# Patient Record
Sex: Female | Born: 1955
Health system: Southern US, Community
[De-identification: ages and names within clinical notes are randomized; demographics above are authoritative.]

## PROBLEM LIST (undated history)

## (undated) DIAGNOSIS — I10 Essential (primary) hypertension: Secondary | ICD-10-CM

## (undated) HISTORY — PX: NO PAST SURGERIES: SHX2092

## (undated) HISTORY — DX: Essential (primary) hypertension: I10

---

## 2003-07-31 ENCOUNTER — Other Ambulatory Visit: Admission: RE | Admit: 2003-07-31 | Discharge: 2003-07-31 | Payer: Self-pay | Admitting: Obstetrics & Gynecology

## 2003-07-31 ENCOUNTER — Encounter: Admission: RE | Admit: 2003-07-31 | Discharge: 2003-07-31 | Payer: Self-pay | Admitting: Obstetrics and Gynecology

## 2003-07-31 ENCOUNTER — Encounter (INDEPENDENT_AMBULATORY_CARE_PROVIDER_SITE_OTHER): Payer: Self-pay

## 2003-08-08 ENCOUNTER — Encounter: Admission: RE | Admit: 2003-08-08 | Discharge: 2003-08-08 | Payer: Self-pay | Admitting: Obstetrics & Gynecology

## 2003-08-13 ENCOUNTER — Ambulatory Visit (HOSPITAL_COMMUNITY): Admission: RE | Admit: 2003-08-13 | Discharge: 2003-08-13 | Payer: Self-pay | Admitting: *Deleted

## 2003-09-13 ENCOUNTER — Other Ambulatory Visit: Admission: RE | Admit: 2003-09-13 | Discharge: 2003-09-13 | Payer: Self-pay | Admitting: Family Medicine

## 2003-09-13 ENCOUNTER — Encounter: Admission: RE | Admit: 2003-09-13 | Discharge: 2003-09-13 | Payer: Self-pay | Admitting: Obstetrics and Gynecology

## 2003-09-13 ENCOUNTER — Encounter (INDEPENDENT_AMBULATORY_CARE_PROVIDER_SITE_OTHER): Payer: Self-pay | Admitting: Specialist

## 2003-10-23 ENCOUNTER — Encounter: Admission: RE | Admit: 2003-10-23 | Discharge: 2003-10-23 | Payer: Self-pay | Admitting: Obstetrics and Gynecology

## 2004-04-10 ENCOUNTER — Encounter: Admission: RE | Admit: 2004-04-10 | Discharge: 2004-04-10 | Payer: Self-pay | Admitting: Obstetrics and Gynecology

## 2007-11-01 ENCOUNTER — Other Ambulatory Visit: Admission: RE | Admit: 2007-11-01 | Discharge: 2007-11-01 | Payer: Self-pay | Admitting: Gynecology

## 2011-02-06 NOTE — Group Therapy Note (Signed)
NAMEARDYS, HATAWAY NO.:  [id number]   MEDICAL RECORD NO.:  [id number]                   PATIENT TYPE:  OUT   LOCATION:  [facility]                           FACILITY:  [facility]   PHYSICIAN:  Tinnie Gens, MD                     DATE OF BIRTH:  11-13-1955   DATE OF SERVICE:  04/10/2004                                    CLINIC NOTE   CHIEF COMPLAINT:  Abnormal Pap.   HISTORY OF PRESENT ILLNESS:  The patient is a 55 year old gravida 3 para 3  Hispanic female who has a history of abnormal Pap.  She has a history of low-  grade SIL on the Pap with a negative high-risk HPV type, negative colposcopy  biopsy, and negative ECC.   The patient is also complaining of her periods coming a little bit earlier  than usual but no increase in amount.  She also reports that she has started  having hot flashes.   The patient also complains today of some abdominal distention.  She states  that she has pain in her lower abdomen that radiates to the top and then  radiates into her chest.  She reports that she does not feel gaseous, she is  mildly constipated, she denies diarrhea.  She does have nausea associated  with this and she has had emesis x1 only.  The patient has noted no change  in the color or caliber of her stools.   PHYSICAL EXAMINATION TODAY:  VITAL SIGNS:  Her temperature is 97.4, pulse is  66, blood pressure is 146/88, weight is 166.  GENERAL:  She is a well-developed, well-nourished Hispanic female in no  acute distress.  GENITOURINARY:  Reveals normal external female genitalia.  The vagina is  rugated.  The cervix is anterior and without lesion.  The uterus is  retroverted.  The adnexa are without mass or tenderness.   IMPRESSION:  1. History of abnormal Pap.  2. Probable perimenopausal.  3. Abdominal pain and distention, question etiology.   PLAN:  1. Pap smear today.  2. The patient is instructed to return if her periods become more heavy or  bleeding becomes worse for endometrial biopsy.  3. The patient will be referred to [facility] for workup     and evaluation of her abdominal pain and distention.                                               Tinnie Gens, MD    TP/MEDQ  D:  04/10/2004  T:  04/10/2004  Job:  [id number]

## 2011-02-06 NOTE — Group Therapy Note (Signed)
   Shelia Shelia [name], Shelia [name] NO.:  [id number]   MEDICAL RECORD NO.:  [id number]                   PATIENT TYPE:  OUT   LOCATION[facility]                           FACILITY:  [facility]   PHYSICIAN:  Elsie Lincoln, MD                   DATE OF BIRTH:  05-16-1956   DATE OF SERVICE:  07/31/2003                                    CLINIC NOTE   REASON FOR VISIT:  The patient is a 55 year old female G 3, para 3-0-0-3 who  presents for annual GYN exam.  The patient does have irregular periods for  the past five years.  Denies any pelvic pain and is sexually active but does  not take birth control.  The patient also desires some form of birth control  as she is sexually active with her husband.   PAST MEDICAL HISTORY:  Denies.   PAST SURGICAL HISTORY:  Denies.   PAST GYNECOLOGICAL HISTORY:  No ovarian cysts, fibroid tumors, sexually  transmitted diseases, or abnormal Pap smears.  Last Pap smear was in 2003.  Last mammogram in 2003 and reported normal for per patient as they were done  in [place].   ALLERGIES:  No known drug allergies.   MEDICATIONS:  None.   REVIEW OF SYSTEMS:  No chest pain or shortness of breath.  No nausea,  vomiting, dizziness, or change in urinary habits.   PHYSICAL EXAMINATION:  VITAL SIGNS:  Blood pressure 162/87, pulse 56.  GENERAL:  A well-nourished, well-developed, in no apparent distress.  BREASTS:  Probably 2 cm mass in the left breast, right upper quadrant of the  left breast.  No other masses or skin changes or nipple discharge.  ABDOMEN:  Soft, nontender, nondistended.  PELVIC:  External genitalia Tanner V.  Vagina no discharge, no blood, no  lesions.  Cervix closed, nontender.  Uterus second-degree prolapse,  enlarged, retroverted.  Adnexa no masses, nontender.  Rectovaginal exam no  masses.  Hemoccult done.   ASSESSMENT AND PLAN:  A 55 year old female with:  1. Abnormal uterine bleeding.  Pap smear done.  Endometrial  biopsy done.  2. Enlarged uterus.  Ordered transvaginal ultrasound.  3. Left breast mass.  Will get diagnostic mammogram.  4. Borderline questionable hypertension.  Will repeat blood pressure at next     visit.  5.     After results of biopsy come back, will consider putting the patient on some     type of contraception.  6. Return to clinic in one month.                                               Elsie Lincoln, MD    KL/MEDQ  D:  07/31/2003  T:  07/31/2003  Job:  [id number]

## 2016-12-22 ENCOUNTER — Encounter: Payer: Self-pay | Admitting: Urgent Care

## 2016-12-22 ENCOUNTER — Ambulatory Visit (INDEPENDENT_AMBULATORY_CARE_PROVIDER_SITE_OTHER): Payer: Commercial Managed Care - PPO | Admitting: Urgent Care

## 2016-12-22 VITALS — BP 198/98 | HR 63 | Temp 98.3°F | Resp 18 | Ht 61.5 in | Wt 163.0 lb

## 2016-12-22 DIAGNOSIS — M79605 Pain in left leg: Secondary | ICD-10-CM | POA: Diagnosis not present

## 2016-12-22 DIAGNOSIS — M79602 Pain in left arm: Secondary | ICD-10-CM | POA: Diagnosis not present

## 2016-12-22 DIAGNOSIS — R030 Elevated blood-pressure reading, without diagnosis of hypertension: Secondary | ICD-10-CM

## 2016-12-22 DIAGNOSIS — M79604 Pain in right leg: Secondary | ICD-10-CM

## 2016-12-22 DIAGNOSIS — I10 Essential (primary) hypertension: Secondary | ICD-10-CM | POA: Diagnosis not present

## 2016-12-22 DIAGNOSIS — M79601 Pain in right arm: Secondary | ICD-10-CM

## 2016-12-22 LAB — POCT URINALYSIS DIP (MANUAL ENTRY)
Bilirubin, UA: NEGATIVE
Blood, UA: NEGATIVE
Glucose, UA: NEGATIVE
Ketones, POC UA: NEGATIVE
Leukocytes, UA: NEGATIVE
Nitrite, UA: NEGATIVE
Protein Ur, POC: 100 — AB
Spec Grav, UA: 1.02 (ref 1.030–1.035)
Urobilinogen, UA: 0.2 (ref ?–2.0)
pH, UA: 7 (ref 5.0–8.0)

## 2016-12-22 MED ORDER — AMLODIPINE BESYLATE 5 MG PO TABS: 5.0000 mg | ORAL_TABLET | Freq: Every day | ORAL | 3 refills | Status: DC

## 2016-12-22 NOTE — Patient Instructions (Addendum)
Hipertensin Hypertension La hipertensin, conocida comnmente como presin arterial alta, se produce cuando la sangre bombea en las arterias con mucha fuerza. Las arterias son los vasos sanguneos que transportan la sangre desde el corazn al resto del cuerpo. La hipertensin hace que el corazn haga ms esfuerzo para Chiropodist y [employer] que las arterias se Teacher, music o Advertising account executive. La hipertensin no tratada o no controlada puede causar infarto de miocardio, accidentes cerebrovasculares, enfermedad renal y otros problemas. Una lectura de la presin arterial consiste de un nmero ms alto sobre un nmero ms bajo. En condiciones ideales, la presin arterial debe estar por debajo de 120/80. El primer nmero ("superior") es la presin sistlica. Es la medida de la presin de las arterias cuando el corazn late. El segundo nmero ("inferior") es la presin diastlica. Es la medida de la presin en las arterias cuando el corazn se relaja. Cules son las causas? Se desconoce la causa de esta afeccin. Qu incrementa el riesgo? Algunos factores de riesgo de hipertensin estn bajo su control. Otros no. Factores que puede [employer].  Tener diabetes mellitus tipo 2, colesterol alto, o ambos.  No hacer la cantidad suficiente de actividad fsica o ejercicio.  Tener sobrepeso.  Consumir mucha grasa, azcar, caloras o sal (sodio) en su dieta.  Beber alcohol en exceso. Factores que son difciles o imposibles de modificar   Tener enfermedad renal crnica.  Tener antecedentes familiares de presin arterial alta.  La edad. Los riesgos aumentan con la edad.  La raza. El riesgo es mayor para las Retail banker.  El sexo. Antes de los 45aos, los hombres corren ms [employer]. Despus de los 65aos, las mujeres corren ms [employer].  Tener apnea obstructiva del sueo.  El estrs. Cules son los signos o los sntomas? La presin arterial  extremadamente alta (crisis hipertensiva) puede provocar:  Dolor de [place].  Ansiedad.  Falta de aire.  Hemorragia nasal.  Nuseas y vmitos.  Dolor de pecho intenso.  Una crisis de movimientos que no puede controlar (convulsiones). Cmo se diagnostica? Esta afeccin se diagnostica midiendo su presin arterial mientras se encuentra sentado, con el brazo apoyado sobre una superficie. El brazalete del tensimetro debe colocarse directamente sobre la piel de la parte superior del brazo y al nivel de su corazn. Debe medirla al [facility] veces en el mismo brazo. Determinadas condiciones pueden causar una diferencia de presin arterial entre el brazo izquierdo y Insurance underwriter. Ciertos factores pueden provocar que las lecturas de la presin arterial sean inferiores o superiores a lo normal (elevadas) por un perodo corto de tiempo:  Si su presin arterial es ms alta cuando se encuentra en el consultorio del mdico que cuando la mide en su hogar, se denomina "hipertensin de bata blanca". La [employer] de las personas que tienen esta afeccin no deben ser [employer].  Si su presin arterial es ms alta en el hogar que cuando se encuentra en el consultorio del mdico, se denomina "hipertensin enmascarada". [employer] de las personas que tienen esta afeccin deben ser medicadas para Chief Technology Officer la presin arterial. Si tiene una lecturas de presin arterial alta durante una visita o si tiene presin arterial normal con otros factores de riesgo:  Es posible que se le pida que regrese Administrator, arts para volver a Chief Technology Officer su presin arterial.  Se le puede pedir que se controle la presin arterial en su casa durante 1 semana o ms. Si se le diagnostica hipertensin, es posible que se  le realicen otros anlisis de sangre o estudios de diagnstico por imgenes para ayudar a su mdico a comprender su riesgo general de tener otras afecciones. Cmo se trata? Esta afeccin se trata haciendo cambios saludables en el  estilo de vida, tales como ingerir alimentos saludables, realizar ms ejercicio y reducir el consumo de alcohol. El mdico puede recetarle medicamentos si los cambios en el estilo de vida no son suficientes para lograr controlar la presin arterial y si:  Su presin arterial sistlica est por encima de 130.  Su presin arterial diastlica est por encima de 80. La presin arterial deseada puede variar en funcin de las enfermedades, la edad y otros factores personales. Siga estas instrucciones en su casa: Comida y bebida   Siga una dieta con alto contenido de fibras y potasio, y con bajo contenido de sodio, azcar agregada y grasas. Un ejemplo de plan alimenticio es la dieta DASH (Dietary Approaches to Stop Hypertension, Mtodos alimenticios para detener la hipertensin). Para alimentarse de esta manera:  Coma mucha fruta y verdura fresca. Trate de que la mitad del plato de cada comida sea de frutas y verduras.  Coma cereales integrales, como pasta integral, arroz integral y pan integral. Llene aproximadamente un cuarto del plato con cereales integrales.  Coma y beba productos lcteos con bajo contenido de grasa, como leche descremada o yogur bajo en grasas.  Evite la ingesta de cortes de carne grasa, carne procesada o curada, y carne de ave con piel. Llene aproximadamente un cuarto del plato con protenas magras, como pescado, pollo sin piel, frijoles, huevos y tofu.  Evite ingerir alimentos prehechos o procesados. En general, estos tienen mayor cantidad de sodio, azcar agregada y grasa.  Reduzca su ingesta diaria de sodio. La mayora de las personas que tienen hipertensin deben comer menos de 1500 mg de sodio por da.  Limite el consumo de alcohol a no ms de 1 medida por da si es mujer y no est embarazada y a 2 medidas por da si es hombre. Una medida equivale a 12onzas de cerveza, 5onzas de vino o 1onzas de bebidas alcohlicas de alta graduacin. Estilo de vida   Trabaje con su  mdico para mantener un peso saludable o perder peso. Pregntele cual es su peso recomendado.  Realice al menos 30 minutos de ejercicio que haga que se acelere su corazn (ejercicio aerbico) la mayora de los das de la semana. Estas actividades pueden incluir caminar, nadar o andar en bicicleta.  Incluya ejercicios para fortalecer sus msculos (ejercicios de resistencia), como pilates o levantamiento de pesas, como parte de su rutina semanal de ejercicios. Intente realizar 30minutos de este tipo de ejercicios al menos tres das a la semana.  No consuma ningn producto que contenga nicotina o tabaco, como cigarrillos y cigarrillos electrnicos. Si necesita ayuda para dejar de fumar, consulte al mdico.  Contrlese la presin arterial en su casa segn las indicaciones del mdico.  Concurra a todas las visitas de control como se lo haya indicado el mdico. Esto es importante. Medicamentos   Tome los medicamentos de venta libre y los recetados solamente como se lo haya indicado el mdico. Siga cuidadosamente las indicaciones. Los medicamentos para la presin arterial deben tomarse segn las indicaciones.  No omita las dosis de medicamentos para la presin arterial. Si lo hace, estar en riesgo de tener problemas y puede hacer que los medicamentos sean menos eficaces.  Pregntele a su mdico a qu efectos secundarios o reacciones a los medicamentos debe prestar atencin. Comunquese   con un mdico si:  Piensa que tiene una reaccin a un medicamento que est tomando.  Tiene dolores de cabeza frecuentes (recurrentes).  Siente mareos.  Tiene hinchazn en los tobillos.  Tiene problemas de visin. Solicite ayuda de inmediato si:  Siente un dolor de cabeza intenso o confusin.  Siente debilidad inusual o adormecimiento.  Siente que va a desmayarse.  Siente un dolor intenso en el pecho o el abdomen.  Vomita repetidas veces.  Tiene dificultad para respirar. Resumen  La hipertensin  se produce cuando la sangre bombea en las arterias con mucha fuerza. Si esta afeccin no se controla, podra correr riesgo de tener complicaciones graves.  La presin arterial deseada puede variar en funcin de las enfermedades, la edad y otros factores personales. Para la mayora de las personas, una presin arterial normal es menor que 120/80.  La hipertensin se trata con cambios en el estilo de vida, medicamentos o una combinacin de ambos. Los cambios en el estilo de vida incluyen prdida de peso, ingerir alimentos sanos, seguir una dieta baja en sodio, hacer ms ejercicio y limitar el consumo de alcohol. Esta informacin no tiene como fin reemplazar el consejo del mdico. Asegrese de hacerle al mdico cualquier pregunta que tenga. Document Released: 09/07/2005 Document Revised: 08/19/2016 Document Reviewed: 08/19/2016 Elsevier Interactive Patient Education  2017 Elsevier Inc.     IF you received an x-ray today, you will receive an invoice from [facility]. Please contact [facility] at [phone] with questions or concerns regarding your invoice.   IF you received labwork today, you will receive an invoice from [facility]. Please contact [facility] at [phone] with questions or concerns regarding your invoice.   Our billing staff will not be able to assist you with questions regarding bills from these companies.  You will be contacted with the lab results as soon as they are available. The fastest way to get your results is to activate your My Chart account. Instructions are located on the last page of this paperwork. If you have not heard from us regarding the results in 2 weeks, please contact this office.     

## 2016-12-22 NOTE — Progress Notes (Signed)
  MRN: [id number] DOB: 1955/11/26  Subjective:   Shelia [name] is a 61 y.o. female presenting for chief complaint of Arm Pain (Bilateral) and Leg Pain (Bilateral)  Reports 4 month history of bilateral arm and leg pain. She has also had intermittent epigastric pain and breast pain for the past year. Admits that it can be associated with mild headaches and nausea. Denies fever, chest pain, heart racing, shob, confusion, dizziness, lower leg swelling, hematuria. Denies smoking cigarettes. She was taking a BP medication but stopped taking it since she could not keep up with transportation to her office visits. Has to take taxis. Also admits that she works very long hours and has a heavy work burden. Lifts a lot of heavy items working at [employer].  Shelia [name] is not currently taking any medications. Also has No Known Allergies. Shelia [name] has pmh of HTN. Denies past surgical history. Denies family history of cancer, diabetes, HTN, HL, heart disease, stroke, mental illness.   Objective:   Vitals: BP (!) 198/98 (BP Location: Left Arm, Patient Position: Sitting, Cuff Size: Small)   Pulse 63   Temp 98.3 F (36.8 C) (Oral)   Resp 18   Ht 5' 1.5" (1.562 m)   Wt 163 lb (73.9 kg)   SpO2 97%   BMI 30.30 kg/m   Physical Exam  Constitutional: She is oriented to person, place, and time. She appears well-developed and well-nourished.  HENT:  Mouth/Throat: Oropharynx is clear and moist.  Eyes: Pupils are equal, round, and reactive to light. No scleral icterus.  Neck: Normal range of motion. Neck supple. No thyromegaly present.  Cardiovascular: Normal rate, regular rhythm and intact distal pulses.  Exam reveals no gallop and no friction rub.   No murmur heard. Pulmonary/Chest: No respiratory distress. She has no wheezes. She has no rales.  Abdominal: Soft. Bowel sounds are normal. She exhibits no distension and no mass. There is no tenderness. There is no guarding.  Musculoskeletal: She  exhibits no edema.  Neurological: She is alert and oriented to person, place, and time. She displays normal reflexes. No cranial nerve deficit. Coordination normal.  Skin: Skin is warm and dry. Capillary refill takes less than 2 seconds.  Psychiatric: She has a normal mood and affect.   Results for orders placed or performed in visit on 12/22/16 (from the past 24 hour(s))  POCT urinalysis dipstick     Status: Abnormal   Collection Time: 12/22/16  3:41 PM  Result Value Ref Range   Color, UA yellow yellow   Clarity, UA clear clear   Glucose, UA negative negative   Bilirubin, UA negative negative   Ketones, POC UA negative negative   Spec Grav, UA 1.020 1.030 - 1.035   Blood, UA negative negative   pH, UA 7.0 5.0 - 8.0   Protein Ur, POC =100 (A) negative   Urobilinogen, UA 0.2 Negative - 2.0   Nitrite, UA Negative Negative   Leukocytes, UA Negative Negative   ECG interpretation - normal sinus rhythm, no acute findings, no previous ecg for comparison.  Assessment and Plan :   1. Elevated blood pressure reading 2. Bilateral arm pain 3. Bilateral leg pain 4. Essential hypertension - Unclear etiology. May be related to her severely elevated HTN, lack of hydration and level of activity. Wrote out of work. Start  amlodipine. Advised aggressive hydration. Labs pending. Recheck on 12/24/2016.  Wallis Bamberg, PA-C [facility] at [facility] [phone] 12/22/2016  2:48 PM

## 2016-12-23 LAB — CMP14+EGFR
ALT: 23 IU/L (ref 0–32)
AST: 22 IU/L (ref 0–40)
Albumin/Globulin Ratio: 1.2 (ref 1.2–2.2)
Albumin: 4.1 g/dL (ref 3.6–4.8)
Alkaline Phosphatase: 117 IU/L (ref 39–117)
BUN/Creatinine Ratio: 29 — ABNORMAL HIGH (ref 12–28)
BUN: 16 mg/dL (ref 8–27)
Bilirubin Total: 0.8 mg/dL (ref 0.0–1.2)
CO2: 26 mmol/L (ref 18–29)
Calcium: 9.1 mg/dL (ref 8.7–10.3)
Chloride: 101 mmol/L (ref 96–106)
Creatinine, Ser: 0.56 mg/dL — ABNORMAL LOW (ref 0.57–1.00)
GFR calc Af Amer: 117 mL/min/{1.73_m2} (ref >59)
GFR calc non Af Amer: 102 mL/min/{1.73_m2} (ref >59)
Globulin, Total: 3.3 g/dL (ref 1.5–4.5)
Glucose: 96 mg/dL (ref 65–99)
Potassium: 4.1 mmol/L (ref 3.5–5.2)
Sodium: 142 mmol/L (ref 134–144)
Total Protein: 7.4 g/dL (ref 6.0–8.5)

## 2016-12-23 LAB — MICROALBUMIN / CREATININE URINE RATIO
Creatinine, Urine: 62.4 mg/dL
Microalb/Creat Ratio: 759.6 mg/g creat — ABNORMAL HIGH (ref 0.0–30.0)
Microalbumin, Urine: 474 ug/mL

## 2016-12-23 LAB — CBC
Hematocrit: 40.6 % (ref 34.0–46.6)
Hemoglobin: 13.4 g/dL (ref 11.1–15.9)
MCH: 27.6 pg (ref 26.6–33.0)
MCHC: 33 g/dL (ref 31.5–35.7)
MCV: 84 fL (ref 79–97)
Platelets: 279 10*3/uL (ref 150–379)
RBC: 4.85 x10E6/uL (ref 3.77–5.28)
RDW: 14.6 % (ref 12.3–15.4)
WBC: 6.3 10*3/uL (ref 3.4–10.8)

## 2016-12-23 LAB — CK: Total CK: 79 U/L (ref 24–173)

## 2016-12-23 LAB — SEDIMENTATION RATE: Sed Rate: 20 mm/hr (ref 0–40)

## 2016-12-24 ENCOUNTER — Ambulatory Visit (INDEPENDENT_AMBULATORY_CARE_PROVIDER_SITE_OTHER): Payer: Commercial Managed Care - PPO | Admitting: Urgent Care

## 2016-12-24 ENCOUNTER — Encounter: Payer: Self-pay | Admitting: Urgent Care

## 2016-12-24 VITALS — BP 187/105 | HR 72 | Temp 97.8°F | Resp 18 | Ht 61.0 in | Wt 159.8 lb

## 2016-12-24 DIAGNOSIS — M79602 Pain in left arm: Secondary | ICD-10-CM

## 2016-12-24 DIAGNOSIS — M79605 Pain in left leg: Secondary | ICD-10-CM

## 2016-12-24 DIAGNOSIS — M79604 Pain in right leg: Secondary | ICD-10-CM

## 2016-12-24 DIAGNOSIS — R030 Elevated blood-pressure reading, without diagnosis of hypertension: Secondary | ICD-10-CM

## 2016-12-24 DIAGNOSIS — I10 Essential (primary) hypertension: Secondary | ICD-10-CM | POA: Insufficient documentation

## 2016-12-24 DIAGNOSIS — M79601 Pain in right arm: Secondary | ICD-10-CM

## 2016-12-24 DIAGNOSIS — R809 Proteinuria, unspecified: Secondary | ICD-10-CM | POA: Insufficient documentation

## 2016-12-24 MED ORDER — LISINOPRIL 20 MG PO TABS: 20.0000 mg | ORAL_TABLET | Freq: Every day | ORAL | 1 refills | Status: DC

## 2016-12-24 NOTE — Patient Instructions (Addendum)
Obtencin de [place] durante 24horas 24-Hour Urine Collection Cmo recolecto una [place] de orina durante 24horas?  Cuando se levante por la maana, orine en el inodoro y tire de la cadena. Anote la hora. Esta ser la hora inicial del da de la obtencin de la [place] y la hora de finalizacin a la maana siguiente.  De all en adelante, recolecte toda la orina en la botella plstica que Fish farm manager.  Deje de recolectar la orina 24horas despus de haber comenzado a hacerlo.  Pueden entregarle una botella plstica que ya contenga lquido. Eso est bien. No deseche el lquido ni enjuague la botella. Para algunos anlisis, se debe agregar un determinado lquido a la orina.  Mantenga fresca la botella plstica dentro de una hielera o gurdela en el refrigerador durante el anlisis.  Cuando hayan pasado 24horas, lleve la botella plstica al laboratorio clnico. Mantenga fresca la botella dentro de una hielera mientras la transporta al laboratorio. Esta informacin no tiene Theme park manager el consejo del mdico. Asegrese de hacerle al mdico cualquier pregunta que tenga. Document Released: 10/10/2010 Document Revised: 08/26/2016 Document Reviewed: 01/31/2014 Elsevier Interactive Patient Education  2017 Elsevier Inc.     Hipertensin Hypertension La hipertensin, conocida comnmente como presin arterial alta, se produce cuando la sangre bombea en las arterias con mucha fuerza. Las arterias son los vasos sanguneos que transportan la sangre desde el corazn al resto del cuerpo. La hipertensin hace que el corazn haga ms esfuerzo para Insurance account manager y Sears Holdings Corporation que las arterias se Armed forces training and education officer o Multimedia programmer. La hipertensin no tratada o no controlada puede causar infarto de miocardio, accidentes cerebrovasculares, enfermedad renal y otros problemas. Una lectura de la presin arterial consiste de un nmero ms alto sobre un nmero ms bajo. En condiciones ideales, la  presin arterial debe estar por debajo de 120/80. El primer nmero ("superior") es la presin sistlica. Es la medida de la presin de las arterias cuando el corazn late. El segundo nmero ("inferior") es la presin diastlica. Es la medida de la presin en las arterias cuando el corazn se relaja. Cules son las causas? Se desconoce la causa de esta afeccin. Qu incrementa el riesgo? Algunos factores de riesgo de hipertensin estn bajo su control. Otros no. Factores que puede Exelon Corporation.  Tener diabetes mellitus tipo 2, colesterol alto, o ambos.  No hacer la cantidad suficiente de actividad fsica o ejercicio.  Tener sobrepeso.  Consumir mucha grasa, azcar, caloras o sal (sodio) en su dieta.  Beber alcohol en exceso. Factores que son difciles o imposibles de modificar   Tener enfermedad renal crnica.  Tener antecedentes familiares de presin arterial alta.  La edad. Los riesgos aumentan con la edad.  La raza. El riesgo es mayor para las Statistician.  El sexo. Antes de los 45aos, los hombres corren ms [employer]. Despus de los 65aos, las mujeres corren ms [employer].  Tener apnea obstructiva del sueo.  El estrs. Cules son los signos o los sntomas? La presin arterial extremadamente alta (crisis hipertensiva) puede provocar:  Dolor de [place].  Ansiedad.  Falta de aire.  Hemorragia nasal.  Nuseas y vmitos.  Dolor de pecho intenso.  Una crisis de movimientos que no puede controlar (convulsiones). Cmo se diagnostica? Esta afeccin se diagnostica midiendo su presin arterial mientras se encuentra sentado, con el brazo apoyado sobre una superficie. El brazalete del tensimetro debe colocarse directamente sobre la piel de la parte superior del brazo y al  nivel de su corazn. Debe medirla al [facility] veces en el mismo brazo. Determinadas condiciones pueden causar una diferencia de presin arterial entre el  brazo izquierdo y Aeronautical engineer. Ciertos factores pueden provocar que las lecturas de la presin arterial sean inferiores o superiores a lo normal (elevadas) por un perodo corto de tiempo:  Si su presin arterial es ms alta cuando se encuentra en el consultorio del mdico que cuando la mide en su hogar, se denomina "hipertensin de bata blanca". La Harley-Davidson de las personas que tienen esta afeccin no deben ser Engel[employer] Corporation.  Si su presin arterial es ms alta en el hogar que cuando se encuentra en el consultorio del mdico, se denomina "hipertensin enmascarada". La Harley-Davidson de las personas que tienen esta afeccin deben ser medicadas para Chief Operating Officer la presin arterial. Si tiene una lecturas de presin arterial alta durante una visita o si tiene presin arterial normal con otros factores de riesgo:  Es posible que se le pida que regrese Banker para volver a Chief Operating Officer su presin arterial.  Se le puede pedir que se controle la presin arterial en su casa durante 1 semana o ms. Si se le diagnostica hipertensin, es posible que se le realicen otros anlisis de sangre o estudios de diagnstico por imgenes para ayudar a su mdico a comprender su riesgo general de tener otras afecciones. Cmo se trata? Esta afeccin se trata haciendo cambios saludables en el estilo de vida, tales como ingerir alimentos saludables, realizar ms ejercicio y reducir el consumo de alcohol. El mdico puede recetarle medicamentos si los cambios en el estilo de vida no son suficientes para Museum/gallery curator la presin arterial y si:  Su presin arterial sistlica est por encima de 130.  Su presin arterial diastlica est por encima de 80. La presin arterial deseada puede variar en funcin de las enfermedades, la edad y otros factores personales. Siga estas instrucciones en su casa: Comida y bebida   Siga una dieta con alto contenido de fibras y [place], y con bajo contenido de sodio, International aid/development worker agregada y Neurosurgeon. Un ejemplo de  plan alimenticio es la dieta DASH (Dietary Approaches to Stop Hypertension, Mtodos alimenticios para detener la hipertensin). Para alimentarse de esta manera:  Coma mucha fruta y verdura fresca. Trate de que la mitad del plato de cada comida sea de frutas y verduras.  Coma cereales integrales, como pasta integral, arroz integral y pan integral. Llene aproximadamente un cuarto del plato con cereales integrales.  Coma y beba productos lcteos con bajo contenido de grasa, como leche descremada o yogur bajo en grasas.  Evite la ingesta de cortes de carne grasa, carne procesada o curada, y carne de ave con piel. Llene aproximadamente un cuarto del plato con protenas magras, como pescado, pollo sin piel, frijoles, huevos y tofu.  Evite ingerir alimentos prehechos o procesados. En general, estos tienen mayor cantidad de sodio, azcar agregada y Steffanie Rainwater.  Reduzca su ingesta diaria de sodio. La mayora de las personas que tienen hipertensin deben comer menos de 1500 mg de sodio por [name].  Limite el consumo de alcohol a no ms de 1 medida por da si es mujer y no est Orthoptist y a 2 medidas por da si es hombre. Una medida equivale a 12onzas de cerveza, 5onzas de vino o 1onzas de bebidas alcohlicas de alta graduacin. Estilo de vida   Trabaje con su mdico para mantener un peso saludable o Curator. Pregntele cual es su peso recomendado.  Realice al menos 30 minutos  de ejercicio que haga que se acelere su corazn (ejercicio Magazine features editor) la [employer]V de la [place]. Estas actividades pueden incluir caminar, nadar o andar en bicicleta.  Incluya ejercicios para fortalecer sus msculos (ejercicios de resistencia), como pilates o levantamiento de pesas, como parte de su rutina semanal de ejercicios. Intente realizar de este tipo de ejercicios al Kellogg a la [place].  No consuma ningn producto que contenga nicotina o tabaco, como cigarrillos y Administrator, Civil Service. Si  necesita ayuda para dejar de fumar, consulte al mdico.  Contrlese la presin arterial en su casa segn las indicaciones del mdico.  Concurra a todas las visitas de control como se lo haya indicado el mdico. Esto es importante. Medicamentos   [employer] International de venta libre y los recetados solamente como se lo haya indicado el mdico. Siga cuidadosamente las indicaciones. Los medicamentos para la presin arterial deben tomarse segn las indicaciones.  No omita las dosis de medicamentos para la presin arterial. Si lo hace, estar en riesgo de tener problemas y puede hacer que los medicamentos sean menos eficaces.  Pregntele a su mdico a qu efectos secundarios o reacciones a los Museum/gallery curator. Comunquese con un mdico si:  Piensa que tiene una reaccin a un medicamento que est tomando.  Tiene dolores de cabeza frecuentes (recurrentes).  Siente mareos.  Tiene hinchazn en los tobillos.  Tiene problemas de visin. Solicite ayuda de inmediato si:  Siente un dolor de cabeza intenso o confusin.  Siente debilidad inusual o adormecimiento.  Siente que va a desmayarse.  Siente un dolor intenso en el pecho o el abdomen.  Vomita repetidas veces.  Tiene dificultad para respirar. Resumen  La hipertensin se produce cuando la sangre bombea en las arterias con mucha fuerza. Si esta afeccin no se controla, podra correr riesgo de tener complicaciones graves.  La presin arterial deseada puede variar en funcin de las enfermedades, la edad y otros factores personales. Para la [employer], una presin arterial normal es menor que 120/80.  La hipertensin se trata con cambios en el estilo de vida, medicamentos o una combinacin de [place]. Los Danaher Corporation estilo de vida incluyen prdida de peso, ingerir alimentos sanos, seguir una dieta baja en sodio, hacer ms ejercicio y Glass blower/designer consumo de alcohol. Esta informacin no tiene Microbiologist el consejo del mdico. Asegrese de hacerle al mdico cualquier pregunta que tenga. Document Released: 09/07/2005 Document Revised: 08/19/2016 Document Reviewed: 08/19/2016 Elsevier Interactive Patient Education  2017 [employer]. iuii

## 2016-12-24 NOTE — Progress Notes (Signed)
   MRN: [id number] DOB: December 30, 1955  Subjective:   Shelia [name] is a 61 y.o. female presenting for follow up on HTN, limb pain. Last office visit was 12/22/2016. Cmet showed normal creatinine function and plan was to start aggressive HTN management should this check out. However, patient also has microalbuminuria. Today, patient reports that her arm and leg pain have improved significantly. She has strenuous job, started hydrating much better. However, patient would like a couple more days from work to rest and get her blood pressure under control. Denies dizziness, chronic headache, blurred vision, chest pain, shortness of breath, heart racing, palpitations, nausea, vomiting, abdominal pain, hematuria, lower leg swelling. Denies smoking cigarettes.  Shelia [name] has a current medication list which includes the following prescription(s): amlodipine. Also has No Known Allergies. Shelia [name]  has a past medical history of Hypertension. Also denies past surgical history.  Objective:   Vitals: BP (!) 187/105 (BP Location: Right Arm, Patient Position: Sitting, Cuff Size: Normal)   Pulse 72   Temp 97.8 F (36.6 C) (Oral)   Resp 18   Ht  (1.549 m)   Wt 159 lb 12.8 oz (72.5 kg)   SpO2 97%   BMI 30.19 kg/m   Physical Exam  Constitutional: She is oriented to person, place, and time. She appears well-developed and well-nourished.  HENT:  Mouth/Throat: Oropharynx is clear and moist.  Eyes: Pupils are equal, round, and reactive to light. No scleral icterus.  Cardiovascular: Normal rate, regular rhythm and intact distal pulses.  Exam reveals no gallop and no friction rub.   No murmur heard. Pulmonary/Chest: No respiratory distress. She has no wheezes. She has no rales.  Musculoskeletal: She exhibits no edema.  Neurological: She is alert and oriented to person, place, and time.  Skin: Skin is warm and dry.  Psychiatric: She has a normal mood and affect.   Assessment and Plan :   This  case was precepted with Dr. Clelia Croft.   1. Elevated blood pressure reading 2. Essential hypertension - Continue amlodipine, maintain healthy diet. Start lisinopril . Check BP at home and write down readings. Return-to-clinic precautions discussed, patient verbalized understanding. Otherwise, follow up in 4 weeks.  3. Microalbuminuria - Patient will rtc with urine collection. - Protein, urine, 24 hour; Future  4. Bilateral arm pain 5. Bilateral leg pain - Improved, monitor.  Wallis Bamberg, PA-C [facility], [facility] [phone] 12/24/2016 9:20 AM

## 2016-12-26 NOTE — Addendum Note (Signed)
Addended by: Chapman Moss C on: 12/26/2016 12:00 PM   Modules accepted: Orders

## 2016-12-27 LAB — PROTEIN, URINE, 24 HOUR
Protein, 24H Urine: 205 mg/24 hr — ABNORMAL HIGH (ref 30–150)
Protein, Ur: 34.2 mg/dL

## 2017-01-14 ENCOUNTER — Ambulatory Visit: Payer: Commercial Managed Care - PPO | Admitting: Urgent Care

## 2017-06-18 ENCOUNTER — Encounter: Payer: Self-pay | Admitting: Family Medicine

## 2017-06-18 ENCOUNTER — Ambulatory Visit (INDEPENDENT_AMBULATORY_CARE_PROVIDER_SITE_OTHER): Payer: Commercial Managed Care - PPO | Admitting: Family Medicine

## 2017-06-18 VITALS — BP 200/94 | HR 63 | Temp 98.6°F | Resp 18 | Ht 61.61 in | Wt 163.2 lb

## 2017-06-18 DIAGNOSIS — Z1211 Encounter for screening for malignant neoplasm of colon: Secondary | ICD-10-CM

## 2017-06-18 DIAGNOSIS — I10 Essential (primary) hypertension: Secondary | ICD-10-CM | POA: Diagnosis not present

## 2017-06-18 DIAGNOSIS — Z1231 Encounter for screening mammogram for malignant neoplasm of breast: Secondary | ICD-10-CM | POA: Diagnosis not present

## 2017-06-18 DIAGNOSIS — Z01419 Encounter for gynecological examination (general) (routine) without abnormal findings: Secondary | ICD-10-CM

## 2017-06-18 LAB — POCT URINALYSIS DIP (MANUAL ENTRY)
Bilirubin, UA: NEGATIVE
Glucose, UA: NEGATIVE mg/dL
Ketones, POC UA: NEGATIVE mg/dL
Leukocytes, UA: NEGATIVE
Nitrite, UA: NEGATIVE
Protein Ur, POC: 100 mg/dL — AB
Spec Grav, UA: 1.02 (ref 1.010–1.025)
Urobilinogen, UA: 0.2 E.U./dL
pH, UA: 7 (ref 5.0–8.0)

## 2017-06-18 MED ORDER — CHLORTHALIDONE 25 MG PO TABS: 25.0000 mg | ORAL_TABLET | Freq: Every day | ORAL | 2 refills | Status: DC

## 2017-06-18 MED ORDER — LISINOPRIL 20 MG PO TABS: 20.0000 mg | ORAL_TABLET | Freq: Every day | ORAL | 2 refills | Status: DC

## 2017-06-18 NOTE — Patient Instructions (Addendum)
   IF you received an x-ray today, you will receive an invoice from [facility]. Please contact [facility] at [phone] with questions or concerns regarding your invoice.   IF you received labwork today, you will receive an invoice from [facility]. Please contact [facility] at [phone] with questions or concerns regarding your invoice.   Our billing staff will not be able to assist you with questions regarding bills from these companies.  You will be contacted with the lab results as soon as they are available. The fastest way to get your results is to activate your My Chart account. Instructions are located on the last page of this paperwork. If you have not heard from us regarding the results in 2 weeks, please contact this office.    Plan de alimentacin DASH (DASH Eating Plan) DASH es la sigla en ingls de "Enfoques Alimentarios para Detener la Hipertensin". El plan de alimentacin DASH ha demostrado bajar la presin arterial elevada (hipertensin). Los beneficios adicionales para la salud pueden incluir la disminucin del riesgo de diabetes mellitus tipo2, enfermedades cardacas e ictus. Este plan tambin puede ayudar a adelgazar. QU DEBO SABER ACERCA DEL PLAN DE ALIMENTACIN DASH? Para el plan de alimentacin DASH, seguir las siguientes pautas generales:  Elija los alimentos que contienen menos de 150 miligramos de sodio por porcin (segn se indica en la etiqueta de los alimentos).  Use hierbas o aderezos sin sal, en lugar de sal de mesa o sal marina.  Consulte al mdico o farmacutico antes de usar sustitutos de la sal.  Consuma los productos con menor contenido de sodio. Estos productos suelen estar etiquetados como "bajo en sodio" o "sin agregado de sal".  Coma alimentos frescos. No consuma una gran cantidad de alimentos enlatados.  Coma ms verduras, frutas y productos lcteos con bajo contenido de grasas.  Elija los cereales integrales. Busque la  palabra "integral" en el primer lugar de la lista de ingredientes.  Elija el pescado y el pollo o el pavo sin piel ms a menudo que las carnes rojas. Limite el consumo de pescado, carne de ave y carne a 6onzas (170g) por da.  Limite el consumo de dulces, postres, azcares y bebidas azucaradas.  Elija las grasas saludables para el corazn.  Consuma ms comida casera y menos de restaurante, de buf y comida rpida.  Limite el consumo de alimentos fritos.  No fra los alimentos. A la hora de cocinarlos, opte por hornearlos, hervirlos, grillarlos y asarlos a la parrilla.  Cuando coma en un restaurante, pida que preparen su comida con menos sal o, en lo posible, sin nada de sal. QU ALIMENTOS PUEDO COMER? Pida ayuda a un nutricionista para conocer las necesidades calricas individuales. Cereales  Pan de salvado o integral. Arroz integral. Pastas de salvado o integrales. Quinua, trigo burgol y cereales integrales. Cereales con bajo contenido de sodio. Tortillas de harina de maz o de salvado. Pan de maz integral. Galletas saladas integrales. Galletas con bajo contenido de sodio. Vegetales  Verduras frescas o congeladas (crudas, al vapor, asadas o grilladas). Jugos de tomate y verduras con contenido bajo o reducido de sodio. Pasta y salsa de tomate con contenido bajo o reducido de sodio. Verduras enlatadas con bajo contenido de sodio o reducido de sodio. Frutas  Frutas frescas, en conserva (en su jugo natural) o frutas congeladas. Carnes y otros productos con protenas  Carne de res molida (al 85% o ms magra), carne de res de animales alimentados con pastos o carne de res sin   pavo sin piel. Carne de pollo o de [place]. Cerdo sin la grasa. Todos los pescados y frutos de mar. Huevos. Porotos, guisantes o lentejas secos. Frutos secos y semillas sin sal. Frijoles enlatados sin sal. Lcteos Productos lcteos con bajo contenido de grasas, como [place] o al 1%, quesos  reducidos en grasas o al 2%, ricota con bajo contenido de grasas o [employer], o yogur natural con bajo contenido de [place]. Quesos con contenido bajo o reducido de sodio. Grasas y Writer en barra que no contengan grasas trans. [place] y alios para ensaladas livianos o reducidos en grasas (reducidos en sodio). [place]. Aceites de crtamo, oliva o canola. Mantequilla natural de man o almendra. Otros Palomitas de maz y pretzels sin sal. Los artculos mencionados arriba pueden no ser Raytheon de las bebidas o los alimentos recomendados. Comunquese con el nutricionista para conocer ms opciones. QU ALIMENTOS NO SE RECOMIENDAN? Cereales Pan blanco. Pastas blancas. Arroz blanco. Pan de maz refinado. Bagels y croissants. Galletas saladas que contengan grasas trans. Vegetales Vegetales con crema o fritos. Verduras en salsa de [place]. Verduras enlatadas comunes. Pasta y salsa de tomate en lata comunes. Jugos comunes de tomate y de verduras. Nils Pyle Fruta enlatada en almbar liviano o espeso. Jugo de frutas. Carnes y otros productos con protenas [place] de carne con Holiday representative. Costillas, alas de pollo, tocineta, salchicha, mortadela, salame, chinchulines, tocino, perros calientes, salchichas alemanas y embutidos envasados. Frutos secos y semillas con sal. Frijoles con sal en lata. Lcteos Leche entera o al 2%, crema, mezcla de [place] y crema, y queso crema. Yogur entero o endulzado. Quesos o queso azul con alto contenido de Neurosurgeon. [place] no lcteas y coberturas batidas. Quesos procesados, quesos para untar o cuajadas. Condimentos Sal de cebolla y ajo, sal condimentada, sal de mesa y sal marina. Salsas en lata y envasadas. Salsa [place]. Salsa trtara. Salsa barbacoa. Salsa teriyaki. Salsa de soja, incluso la que tiene contenido reducido de [place]. Salsa de carne. Salsa de pescado. Salsa de [place]. Salsa rosada. Rbano picante. Ketchup y mostaza. Saborizantes y tiernizantes  para carne. Caldo en cubitos. Salsa picante. Salsa tabasco. Adobos. Aderezos para tacos. Salsas. Grasas y [address], [place] en barra, [place] de [place], [place], [place] clarificada y Steffanie Rainwater de tocino. Aceites de coco, de palmiste o de palma. Aderezos comunes para ensalada. Otros Pickles y [address] [place]. Palomitas de maz y pretzels con sal. Los artculos mencionados arriba pueden no ser Raytheon de las bebidas y los alimentos que se Theatre stage manager. Comunquese con el nutricionista para obtener ms informacin. DNDE Raelyn Mora MS INFORMACIN? Instituto Nacional del [place], del Pulmn y de Risk manager (National Heart, Lung, and Blood Institute): [email or web address] Esta informacin no tiene Theme park manager el consejo del mdico. Asegrese de hacerle al mdico cualquier pregunta que tenga. Document Released: 08/27/2011 Document Revised: 12/30/2015 Document Reviewed: 07/12/2013 Elsevier Interactive Patient Education  2017 [employer].

## 2017-06-18 NOTE — Progress Notes (Signed)
9/28/201811:37 AM  Shelia [name] 1955-09-28, 61 y.o. female [id number]  Chief Complaint  Patient presents with  . Annual Exam    HPI:   Patient is a 61 y.o. female with past medical history significant for HTN who presents today for annual exam.  [id number] Postmenopausal in early 35s Reports last pap and mammo almost 3 years ago Denies h/o abnormal Denies any postmenopausal bleeding Has never had colon cancer screening  Has not taken BP meds as ran out of refills Reports that current regime was making her sleepy, she did not experience that with previous regime, does not remember name of those  She does not smoke, use etoh nor illicit substances does not exercise, but is active at work Not sexually active  Depression screen [facility] 2/9 06/18/2017 12/22/2016  Decreased Interest 0 0  Down, Depressed, Hopeless 0 0  PHQ - 2 Score 0 0    No Known Allergies  No current outpatient prescriptions on file prior to visit.   No current facility-administered medications on file prior to visit.     Past Medical History:  Diagnosis Date  . Hypertension     History reviewed. No pertinent surgical history.  Social History  Substance Use Topics  . Smoking status: Never Smoker  . Smokeless tobacco: Never Used  . Alcohol use No    Patient reports her father died of complications from DM.  Otherwise reports mother and siblings to be healthy. Denies any fhx HTN, CAD, CVA, cancer.  Review of Systems  Constitutional: Negative for chills, fever and malaise/fatigue.  HENT: Negative for congestion, ear pain, nosebleeds, sore throat and tinnitus.   Eyes: Negative for blurred vision and double vision.  Respiratory: Negative for cough and shortness of breath.   Cardiovascular: Negative for chest pain, palpitations, orthopnea, claudication, leg swelling and PND.  Gastrointestinal: Positive for heartburn. Negative for abdominal pain, blood in stool, constipation, diarrhea, melena,  nausea and vomiting.  Genitourinary: Positive for frequency and urgency. Negative for dysuria and hematuria.       Negative for any breast lumps or discharge Negative for any vaginal discharge  Musculoskeletal: Positive for joint pain.  Neurological: Negative for dizziness, sensory change, speech change, focal weakness and headaches.  Endo/Heme/Allergies: Negative for polydipsia.  Psychiatric/Behavioral: The patient has insomnia.      OBJECTIVE:  Blood pressure (!) 200/94, pulse 63, temperature 98.6 F (37 C), temperature source Oral, resp. rate 18, height 5' 1.61" (1.565 m), weight 163 lb 3.2 oz (74 kg), SpO2 98 %.  Physical Exam  Constitutional: She is oriented to person, place, and time and well-developed, well-nourished, and in no distress.  HENT:  Head: Normocephalic and atraumatic.  Right Ear: Hearing, tympanic membrane, external ear and ear canal normal.  Left Ear: Hearing, tympanic membrane, external ear and ear canal normal.  Mouth/Throat: Oropharynx is clear and moist.  Eyes: Pupils are equal, round, and reactive to light. EOM are normal.  Neck: Neck supple. No JVD present. No thyromegaly present.  Cardiovascular: Normal rate, regular rhythm, normal heart sounds and intact distal pulses.  Exam reveals no gallop and no friction rub.   No murmur heard. Pulmonary/Chest: Effort normal and breath sounds normal. She has no wheezes. She has no rales. Right breast exhibits no inverted nipple, no mass, no nipple discharge, no skin change and no tenderness. Left breast exhibits no inverted nipple, no mass, no nipple discharge, no skin change and no tenderness.  Abdominal: Soft. Bowel sounds are normal. She exhibits no distension and  no mass. There is no tenderness.  Genitourinary: Vagina normal, uterus normal, cervix normal, right adnexa normal and left adnexa normal.  Lymphadenopathy:    She has no cervical adenopathy.       Right axillary: No pectoral and no lateral adenopathy  present.       Left axillary: No pectoral and no lateral adenopathy present.      Right: No supraclavicular adenopathy present.       Left: No supraclavicular adenopathy present.  Neurological: She is alert and oriented to person, place, and time. She has normal reflexes. Gait normal.  Skin: Skin is warm and dry.  Psychiatric: Mood and affect normal.    Results for orders placed or performed in visit on 06/18/17 (from the past 24 hour(s))  POCT urinalysis dipstick     Status: Abnormal   Collection Time: 06/18/17  9:35 AM  Result Value Ref Range   Color, UA yellow yellow   Clarity, UA clear clear   Glucose, UA negative negative mg/dL   Bilirubin, UA negative negative   Ketones, POC UA negative negative mg/dL   Spec Grav, UA 1.020 1.010 - 1.025   Blood, UA trace-lysed (A) negative   pH, UA 7.0 5.0 - 8.0   Protein Ur, POC =100 (A) negative mg/dL   Urobilinogen, UA 0.2 0.2 or 1.0 E.U./dL   Nitrite, UA Negative Negative   Leukocytes, UA Negative Negative      ASSESSMENT and PLAN  1. Encounter for annual routine gynecological examination  Annual exam done today. HCM discussed, patient declines any immunizations with informed consent. Anticipatory guidance for healthy lifestyle and choices discussed.   - Pap IG w/ reflex to HPV when ASC-U ([facility]) - CBC with Differential - TSH - Lipid Panel - CMP14+EGFR  2. Visit for screening mammogram - MM Digital Screening; Future  3. Colon cancer screening - IFOBT POC (occult bld, rslt in office); Future  4. Essential hypertension  Discussed LFM important in management of HTN, also wondering if OSA a factor, will cont to monitor, consider STOP-BANG at next visit. Discussed importance of medication adherence, r/se/b reviewed. ER precautions given.  - CBC with Differential - TSH - Lipid Panel - CMP14+EGFR - lisinopril (PRINIVIL,ZESTRIL) 20 MG tablet; Take 1 tablet (20 mg total) by mouth daily. - chlorthalidone (HYGROTON)  25 MG tablet; Take 1 tablet (25 mg total) by mouth daily. - POCT urinalysis dipstick   Return in about 2 weeks (around 07/02/2017).    Rutherford Guys, MD Primary Care at [facility] [place],  [address] Ph.  [phone] Fax [phone]

## 2017-06-19 LAB — CBC WITH DIFFERENTIAL/PLATELET
Basophils Absolute: 0 10*3/uL (ref 0.0–0.2)
Basos: 1 %
EOS (ABSOLUTE): 0.2 10*3/uL (ref 0.0–0.4)
Eos: 3 %
Hematocrit: 38.9 % (ref 34.0–46.6)
Hemoglobin: 13.2 g/dL (ref 11.1–15.9)
Immature Grans (Abs): 0 10*3/uL (ref 0.0–0.1)
Immature Granulocytes: 0 %
Lymphocytes Absolute: 1.5 10*3/uL (ref 0.7–3.1)
Lymphs: 28 %
MCH: 27.6 pg (ref 26.6–33.0)
MCHC: 33.9 g/dL (ref 31.5–35.7)
MCV: 81 fL (ref 79–97)
Monocytes Absolute: 0.5 10*3/uL (ref 0.1–0.9)
Monocytes: 9 %
Neutrophils Absolute: 3.2 10*3/uL (ref 1.4–7.0)
Neutrophils: 59 %
Platelets: 272 10*3/uL (ref 150–379)
RBC: 4.79 x10E6/uL (ref 3.77–5.28)
RDW: 14.4 % (ref 12.3–15.4)
WBC: 5.3 10*3/uL (ref 3.4–10.8)

## 2017-06-19 LAB — CMP14+EGFR
ALT: 33 IU/L — ABNORMAL HIGH (ref 0–32)
AST: 31 IU/L (ref 0–40)
Albumin/Globulin Ratio: 1.4 (ref 1.2–2.2)
Albumin: 4.3 g/dL (ref 3.6–4.8)
Alkaline Phosphatase: 113 IU/L (ref 39–117)
BUN/Creatinine Ratio: 26 (ref 12–28)
BUN: 17 mg/dL (ref 8–27)
Bilirubin Total: 0.8 mg/dL (ref 0.0–1.2)
CO2: 24 mmol/L (ref 20–29)
Calcium: 9 mg/dL (ref 8.7–10.3)
Chloride: 104 mmol/L (ref 96–106)
Creatinine, Ser: 0.65 mg/dL (ref 0.57–1.00)
GFR calc Af Amer: 112 mL/min/{1.73_m2} (ref >59)
GFR calc non Af Amer: 97 mL/min/{1.73_m2} (ref >59)
Globulin, Total: 3 g/dL (ref 1.5–4.5)
Glucose: 110 mg/dL — ABNORMAL HIGH (ref 65–99)
Potassium: 4 mmol/L (ref 3.5–5.2)
Sodium: 140 mmol/L (ref 134–144)
Total Protein: 7.3 g/dL (ref 6.0–8.5)

## 2017-06-19 LAB — TSH: TSH: 5.69 u[IU]/mL — ABNORMAL HIGH (ref 0.450–4.500)

## 2017-06-19 LAB — LIPID PANEL
Chol/HDL Ratio: 5.2 ratio — ABNORMAL HIGH (ref 0.0–4.4)
Cholesterol, Total: 199 mg/dL (ref 100–199)
HDL: 38 mg/dL — ABNORMAL LOW (ref >39)
LDL Calculated: 117 mg/dL — ABNORMAL HIGH (ref 0–99)
Triglycerides: 222 mg/dL — ABNORMAL HIGH (ref 0–149)
VLDL Cholesterol Cal: 44 mg/dL — ABNORMAL HIGH (ref 5–40)

## 2017-06-21 LAB — PAP IG W/ RFLX HPV ASCU: PAP Smear Comment: 0

## 2017-06-21 LAB — HM MAMMOGRAPHY

## 2017-06-22 ENCOUNTER — Telehealth: Payer: Self-pay | Admitting: Family Medicine

## 2017-06-22 ENCOUNTER — Other Ambulatory Visit: Payer: Self-pay | Admitting: Family Medicine

## 2017-06-22 DIAGNOSIS — R7989 Other specified abnormal findings of blood chemistry: Secondary | ICD-10-CM

## 2017-06-22 NOTE — Telephone Encounter (Signed)
Myles Lipps, MD  Pcp Scheduling Pool 1 hour ago (8:14 AM)    Please let patient know that her TSH (thyroid function lab) was abnormal. Sometimes this is transient, need to do more testing to see if indeed she is having thyroid issues and might need medication. Thanks (Routing comment)

## 2017-06-22 NOTE — Telephone Encounter (Signed)
Patient has follow up scheduled.

## 2017-07-02 ENCOUNTER — Ambulatory Visit (INDEPENDENT_AMBULATORY_CARE_PROVIDER_SITE_OTHER): Payer: Commercial Managed Care - PPO | Admitting: Family Medicine

## 2017-07-02 ENCOUNTER — Encounter: Payer: Self-pay | Admitting: Family Medicine

## 2017-07-02 VITALS — BP 138/80 | HR 77 | Temp 98.4°F | Resp 18 | Ht 61.61 in | Wt 160.8 lb

## 2017-07-02 DIAGNOSIS — R7989 Other specified abnormal findings of blood chemistry: Secondary | ICD-10-CM

## 2017-07-02 DIAGNOSIS — R945 Abnormal results of liver function studies: Secondary | ICD-10-CM | POA: Diagnosis not present

## 2017-07-02 DIAGNOSIS — R7301 Impaired fasting glucose: Secondary | ICD-10-CM | POA: Diagnosis not present

## 2017-07-02 DIAGNOSIS — I10 Essential (primary) hypertension: Secondary | ICD-10-CM

## 2017-07-02 NOTE — Progress Notes (Signed)
10/12/201810:58 AM  Shelia [name] [birth year], 61 y.o. female [id number]  Chief Complaint  Patient presents with  . Follow-up    HPI:   Patient is a 61 y.o. female who presents today to fu on her BP after restarting her medications, Last visit 200/100s.  Taking BP meds as rx. Tolerating well  Would like to review lab results.  Has no acute concerns today  Depression screen [facility] 2/9 06/18/2017 12/22/2016  Decreased Interest 0 0  Down, Depressed, Hopeless 0 0  PHQ - 2 Score 0 0    No Known Allergies  Prior to Admission medications   Medication Sig Start Date End Date Taking? Authorizing Provider  chlorthalidone (HYGROTON) 25 MG tablet Take 1 tablet (25 mg total) by mouth daily. 06/18/17  Yes Rutherford Guys, MD  lisinopril (PRINIVIL,ZESTRIL) 20 MG tablet Take 1 tablet (20 mg total) by mouth daily. 06/18/17  Yes Rutherford Guys, MD    Past Medical History:  Diagnosis Date  . Hypertension     History reviewed. No pertinent surgical history.  Social History  Substance Use Topics  . Smoking status: Never Smoker  . Smokeless tobacco: Never Used  . Alcohol use No    Family History  Problem Relation Age of Onset  . Diabetes Father     Review of Systems  Constitutional: Negative for chills and fever.  Respiratory: Negative for cough and shortness of breath.   Cardiovascular: Negative for chest pain, palpitations and leg swelling.  Gastrointestinal: Negative for abdominal pain, nausea and vomiting.     OBJECTIVE:  Blood pressure 138/80, pulse 77, temperature 98.4 F (36.9 C), temperature source Oral, resp. rate 18, height 5' 1.61" (1.565 m), weight 160 lb 12.8 oz (72.9 kg), SpO2 98 %.  Physical Exam  Constitutional: She is oriented to person, place, and time and well-developed, well-nourished, and in no distress.  HENT:  Head: Normocephalic and atraumatic.  Mouth/Throat: Oropharynx is clear and moist. No oropharyngeal exudate.  Eyes: Pupils are  equal, round, and reactive to light. EOM are normal. No scleral icterus.  Neck: Neck supple.  Cardiovascular: Normal rate, regular rhythm and normal heart sounds.  Exam reveals no gallop and no friction rub.   No murmur heard. Pulmonary/Chest: Effort normal and breath sounds normal. She has no wheezes. She has no rales.  Musculoskeletal: She exhibits no edema.  Neurological: She is alert and oriented to person, place, and time. Gait normal.  Skin: Skin is warm and dry.    Recent Results (from the past 2160 hour(s))  Pap IG w/ reflex to HPV when ASC-U [employer] [name] [employer])     Status: None   Collection Time: 06/18/17  9:34 AM  Result Value Ref Range   DIAGNOSIS: Comment     Comment: NEGATIVE FOR INTRAEPITHELIAL LESION AND MALIGNANCY.   Specimen adequacy: Comment     Comment: Satisfactory for evaluation. Endocervical and/or squamous metaplastic cells (endocervical component) are present.    Clinician Provided ICD10 Comment     Comment: Z01.419   Performed by: Comment     Comment: Kaylyn Lim, Cytotechnologist (ASCP)   PAP Smear Comment .    Note: Comment     Comment: The Pap smear is a screening test designed to aid in the detection of premalignant and malignant conditions of the uterine cervix.  It is not a diagnostic procedure and should not be used as the sole means of detecting cervical cancer.  Both false-positive and false-negative reports do occur.    Test Methodology  CANCELED     Comment: The Thin Prep(R) Imager was unable to read this specimen.  Therefore a manual review was performed.  Result canceled by the ancillary    PAP Reflex Comment     Comment: The HPV DNA reflex criteria were not met with this specimen result therefore, no HPV testing was performed.   POCT urinalysis dipstick     Status: Abnormal   Collection Time: 06/18/17  9:35 AM  Result Value Ref Range   Color, UA yellow yellow   Clarity, UA clear clear   Glucose, UA negative negative mg/dL   Bilirubin,  UA negative negative   Ketones, POC UA negative negative mg/dL   Spec Grav, UA 1.020 1.010 - 1.025   Blood, UA trace-lysed (A) negative   pH, UA 7.0 5.0 - 8.0   Protein Ur, POC =100 (A) negative mg/dL   Urobilinogen, UA 0.2 0.2 or 1.0 E.U./dL   Nitrite, UA Negative Negative   Leukocytes, UA Negative Negative  CBC with Differential     Status: None   Collection Time: 06/18/17  9:47 AM  Result Value Ref Range   WBC 5.3 3.4 - 10.8 x10E3/uL   RBC 4.79 3.77 - 5.28 x10E6/uL   Hemoglobin 13.2 11.1 - 15.9 g/dL   Hematocrit 38.9 34.0 - 46.6 %   MCV 81 79 - 97 fL   MCH 27.6 26.6 - 33.0 pg   MCHC 33.9 31.5 - 35.7 g/dL   RDW 14.4 12.3 - 15.4 %   Platelets 272 150 - 379 x10E3/uL   Neutrophils 59 Not Estab. %   Lymphs 28 Not Estab. %   Monocytes 9 Not Estab. %   Eos 3 Not Estab. %   Basos 1 Not Estab. %   Neutrophils Absolute 3.2 1.4 - 7.0 x10E3/uL   Lymphocytes Absolute 1.5 0.7 - 3.1 x10E3/uL   Monocytes Absolute 0.5 0.1 - 0.9 x10E3/uL   EOS (ABSOLUTE) 0.2 0.0 - 0.4 x10E3/uL   Basophils Absolute 0.0 0.0 - 0.2 x10E3/uL   Immature Granulocytes 0 Not Estab. %   Immature Grans (Abs) 0.0 0.0 - 0.1 x10E3/uL  TSH     Status: Abnormal   Collection Time: 06/18/17  9:47 AM  Result Value Ref Range   TSH 5.690 (H) 0.450 - 4.500 uIU/mL  Lipid Panel     Status: Abnormal   Collection Time: 06/18/17  9:47 AM  Result Value Ref Range   Cholesterol, Total 199 100 - 199 mg/dL   Triglycerides 222 (H) 0 - 149 mg/dL   HDL 38 (L) >39 mg/dL   VLDL Cholesterol Cal 44 (H) 5 - 40 mg/dL   LDL Calculated 117 (H) 0 - 99 mg/dL   Chol/HDL Ratio 5.2 (H) 0.0 - 4.4 ratio    Comment:                                   T. Chol/HDL Ratio                                             Men  Women                               1/2 Avg.Risk  3.4    3.3  Avg.Risk  5.0    4.4                                2X Avg.Risk  9.6    7.1                                3X Avg.Risk 23.4   11.0     CMP14+EGFR     Status: Abnormal   Collection Time: 06/18/17  9:47 AM  Result Value Ref Range   Glucose 110 (H) 65 - 99 mg/dL   BUN 17 8 - 27 mg/dL   Creatinine, Ser 0.65 0.57 - 1.00 mg/dL   GFR calc non Af Amer 97 >59 mL/min/1.73   GFR calc Af Amer 112 >59 mL/min/1.73   BUN/Creatinine Ratio 26 12 - 28   Sodium 140 134 - 144 mmol/L   Potassium 4.0 3.5 - 5.2 mmol/L   Chloride 104 96 - 106 mmol/L   CO2 24 20 - 29 mmol/L   Calcium 9.0 8.7 - 10.3 mg/dL   Total Protein 7.3 6.0 - 8.5 g/dL   Albumin 4.3 3.6 - 4.8 g/dL   Globulin, Total 3.0 1.5 - 4.5 g/dL   Albumin/Globulin Ratio 1.4 1.2 - 2.2   Bilirubin Total 0.8 0.0 - 1.2 mg/dL   Alkaline Phosphatase 113 39 - 117 IU/L   AST 31 0 - 40 IU/L   ALT 33 (H) 0 - 32 IU/L    ASSESSMENT and PLAN  1. Essential hypertension At goal. Cont current regime  2. Abnormal thyroid blood test Treatment pending results.  - T4, Free  3. Elevated fasting glucose Reinforced importance of healthy diet and regular exercise - Hemoglobin A1c  4. Abnormal LFTs Minimal, consider rechecking 3-6 months.  Return in about 3 months (around 10/02/2017).    Rutherford Guys, MD Primary Care at [place], [place] [address] Ph.  [phone] Fax [phone]

## 2017-07-02 NOTE — Patient Instructions (Signed)
     IF you received an x-ray today, you will receive an invoice from [facility]. Please contact [facility] at [phone] with questions or concerns regarding your invoice.   IF you received labwork today, you will receive an invoice from [facility]. Please contact [facility] at [phone] with questions or concerns regarding your invoice.   Our billing staff will not be able to assist you with questions regarding bills from these companies.  You will be contacted with the lab results as soon as they are available. The fastest way to get your results is to activate your My Chart account. Instructions are located on the last page of this paperwork. If you have not heard from us regarding the results in 2 weeks, please contact this office.     

## 2017-07-04 LAB — T4, FREE: Free T4: 1.28 ng/dL (ref 0.82–1.77)

## 2017-07-04 LAB — HEMOGLOBIN A1C
Est. average glucose Bld gHb Est-mCnc: 131 mg/dL
Hgb A1c MFr Bld: 6.2 % — ABNORMAL HIGH (ref 4.8–5.6)

## 2017-07-05 ENCOUNTER — Encounter: Payer: Self-pay | Admitting: Family Medicine

## 2017-09-16 ENCOUNTER — Other Ambulatory Visit: Payer: Self-pay | Admitting: Family Medicine

## 2017-09-16 DIAGNOSIS — I10 Essential (primary) hypertension: Secondary | ICD-10-CM

## 2017-09-23 ENCOUNTER — Ambulatory Visit: Payer: Self-pay

## 2017-10-08 ENCOUNTER — Other Ambulatory Visit: Payer: Self-pay

## 2017-10-08 ENCOUNTER — Encounter: Payer: Self-pay | Admitting: Family Medicine

## 2017-10-08 ENCOUNTER — Ambulatory Visit (INDEPENDENT_AMBULATORY_CARE_PROVIDER_SITE_OTHER): Payer: Self-pay | Admitting: Family Medicine

## 2017-10-08 VITALS — BP 152/78 | HR 61 | Temp 97.4°F | Ht 62.0 in | Wt 152.6 lb

## 2017-10-08 DIAGNOSIS — I10 Essential (primary) hypertension: Secondary | ICD-10-CM

## 2017-10-08 DIAGNOSIS — M7632 Iliotibial band syndrome, left leg: Secondary | ICD-10-CM

## 2017-10-08 DIAGNOSIS — R7303 Prediabetes: Secondary | ICD-10-CM

## 2017-10-08 MED ORDER — LISINOPRIL 20 MG PO TABS: 20.0000 mg | ORAL_TABLET | Freq: Every day | ORAL | 11 refills | Status: DC

## 2017-10-08 MED ORDER — CHLORTHALIDONE 25 MG PO TABS: 25.0000 mg | ORAL_TABLET | Freq: Every day | ORAL | 11 refills | Status: DC

## 2017-10-08 MED ORDER — MELOXICAM 7.5 MG PO TABS: 7.5000 mg | ORAL_TABLET | Freq: Every day | ORAL | 2 refills | Status: DC

## 2017-10-08 NOTE — Patient Instructions (Addendum)
IF you received an x-ray today, you will receive an invoice from [facility]. Please contact [facility] at [phone] with questions or concerns regarding your invoice.   IF you received labwork today, you will receive an invoice from [facility]. Please contact [facility] at [phone] with questions or concerns regarding your invoice.   Our billing staff will not be able to assist you with questions regarding bills from these companies.  You will be contacted with the lab results as soon as they are available. The fastest way to get your results is to activate your My Chart account. Instructions are located on the last page of this paperwork. If you have not heard from us regarding the results in 2 weeks, please contact this office.     Iliotibial Band Syndrome Rehab Ask your health care provider which exercises are safe for you. Do exercises exactly as told by your health care provider and adjust them as directed. It is normal to feel mild stretching, pulling, tightness, or discomfort as you do these exercises, but you should stop right away if you feel sudden pain or your pain gets worse.Do not begin these exercises until told by your health care provider. Stretching and range of motion exercises These exercises warm up your muscles and joints and improve the movement and flexibility of your hip and pelvis. Exercise A: Quadriceps, prone  1. Lie on your abdomen on a firm surface, such as a bed or padded floor. 2. Bend your left / right knee and hold your ankle. If you cannot reach your ankle or pant leg, loop a belt around your foot and grab the belt instead. 3. Gently pull your heel toward your buttocks. Your knee should not slide out to the side. You should feel a stretch in the front of your thigh and knee. 4. Hold this position for __________ seconds. Repeat __________ times. Complete this stretch __________ times a day. Exercise B: Iliotibial band  1. Lie on  your side with your left / right leg in the top position. 2. Bend both of your knees and grab your left / right ankle. Stretch out your bottom arm to help you balance. 3. Slowly bring your top knee back so your thigh goes behind your trunk. 4. Slowly lower your top leg toward the floor until you feel a gentle stretch on the outside of your left / right hip and thigh. If you do not feel a stretch and your knee will not fall farther, place the heel of your other foot on top of your knee and pull your knee down toward the floor with your foot. 5. Hold this position for __________ seconds. Repeat __________ times. Complete this stretch __________ times a day. Strengthening exercises These exercises build strength and endurance in your hip and pelvis. Endurance is the ability to use your muscles for a long time, even after they get tired. Exercise C: Straight leg raises ( hip abductors) 1. Lie on your side with your left / right leg in the top position. Lie so your head, shoulder, knee, and hip line up. You may bend your bottom knee to help you balance. 2. Roll your hips slightly forward so your hips are stacked directly over each other and your left / right knee is facing forward. 3. Tense the muscles in your outer thigh and lift your top leg 4-6 inches (10-15 cm). 4. Hold this position for __________ seconds. 5. Slowly return to the starting position. Let your muscles  relax completely before doing another repetition. Repeat __________ times. Complete this exercise __________ times a day. Exercise D: Straight leg raises ( hip extensors) 1. Lie on your abdomen on your bed or a firm surface. You can put a pillow under your hips if that is more comfortable. 2. Bend your left / right knee so your foot is straight up in the air. 3. Squeeze your buttock muscles and lift your left / right thigh off the bed. Do not let your back arch. 4. Tense this muscle as hard as you can without increasing any knee  pain. 5. Hold this position for __________ seconds. 6. Slowly lower your leg to the starting position and allow it to relax completely. Repeat __________ times. Complete this exercise __________ times a day. Exercise E: Hip hike 1. Stand sideways on a bottom step. Stand on your left / right leg with your other foot unsupported next to the step. You can hold onto the railing or wall if needed for balance. 2. Keep your knees straight and your torso square. Then, lift your left / right hip up toward the ceiling. 3. Slowly let your left / right hip lower toward the floor, past the starting position. Your foot should get closer to the floor. Do not lean or bend your knees. Repeat __________ times. Complete this exercise __________ times a day. This information is not intended to replace advice given to you by your health care provider. Make sure you discuss any questions you have with your health care provider. Document Released: 09/07/2005 Document Revised: 05/12/2016 Document Reviewed: 08/09/2015 Elsevier Interactive Patient Education  [employer]2018 [employer]

## 2017-10-08 NOTE — Progress Notes (Signed)
1/18/201911:22 AM  Shelia BenderFilomena [name] 08/28/1956, 62 y.o. female [id number]  Chief Complaint  Patient presents with  . Medication Refill    FOR BP MEDS    HPI:   Patient is a 62 y.o. female with past medical history significant for HTN who presents today for followup  Overall doing well, tolerating meds Does not check BP at home Has not taken BP meds today as she is still fasting A1c 6.2 in Spet 2018, patient has been avoiding simple sugars since then She has also been trying to walk more and has been having intermittent left hip pain after her walks for past several weeks. Denies any trauma.  [facility] 10/08/2017 06/18/2017 12/22/2016  Decreased Interest 0 0 0  Down, Depressed, Hopeless 0 0 0  PHQ - 2 Score 0 0 0    No Known Allergies  Prior to Admission medications   Medication Sig Start Date End Date Taking? Authorizing Provider  lisinopril (PRINIVIL,ZESTRIL) 20 MG tablet TAKE 1 TABLET BY MOUTH ONCE DAILY 09/16/17  Yes Shelia LippsSantiago, Daymien Goth M, MD  chlorthalidone (HYGROTON) 25 MG tablet TAKE 1 TABLET BY MOUTH ONCE DAILY Patient not taking: Reported on 10/08/2017 09/16/17   Shelia LippsSantiago, Sherae Santino M, MD    Past Medical History:  Diagnosis Date  . Hypertension     History reviewed. No pertinent surgical history.  Social History   Tobacco Use  . Smoking status: Never Smoker  . Smokeless tobacco: Never Used  Substance Use Topics  . Alcohol use: No    Family History  Problem Relation Age of Onset  . Diabetes Father     Review of Systems  Constitutional: Negative for chills and fever.  Respiratory: Negative for cough and shortness of breath.   Cardiovascular: Negative for chest pain, palpitations and leg swelling.  Gastrointestinal: Negative for abdominal pain, nausea and vomiting.     OBJECTIVE:  Blood pressure (!) 152/78, pulse 61, temperature (!) 97.4 F (36.3 C), temperature source Oral, height 5\' 2"  (1.575 m), weight 152 lb 9.6 oz (69.2 kg),  SpO2 99 %.  Physical Exam  Constitutional: She is oriented to person, place, and time and well-developed, well-nourished, and in no distress.  HENT:  Head: Normocephalic and atraumatic.  Mouth/Throat: Oropharynx is clear and moist. No oropharyngeal exudate.  Eyes: EOM are normal. Pupils are equal, round, and reactive to light. No scleral icterus.  Neck: Neck supple.  Cardiovascular: Normal rate, regular rhythm and normal heart sounds. Exam reveals no gallop and no friction rub.  No murmur heard. Pulmonary/Chest: Effort normal and breath sounds normal. She has no wheezes. She has no rales.  Musculoskeletal: She exhibits no edema.       Right hip: Normal.       Left hip: She exhibits tenderness (over IT band). She exhibits normal range of motion, normal strength and no bony tenderness.       Lumbar back: Normal.  Neurological: She is alert and oriented to person, place, and time. Gait normal.  Skin: Skin is warm and dry.    ASSESSMENT and PLAN 1. Essential hypertension - chlorthalidone (HYGROTON) 25 MG tablet; Take 1 tablet (25 mg total) by mouth daily. - lisinopril (PRINIVIL,ZESTRIL) 20 MG tablet; Take 1 tablet (20 mg total) by mouth daily.  2. Pre-diabetes Cont with LFM, recheck a1c at next visit  3. It band syndrome, left Discussed supportive measures, new meds r/se/b and RTC precautions. Patient educational handout given. - meloxicam (MOBIC) 7.5 MG tablet; Take 1 tablet (7.5  mg total) by mouth at bedtime.  Return in about 3 months (around 01/06/2018).    Shelia Lipps, MD Primary Care at [facility] [address] [place], [place] [address] Ph.  [phone] Fax [phone]

## 2017-10-17 ENCOUNTER — Encounter: Payer: Self-pay | Admitting: Family Medicine

## 2017-10-17 DIAGNOSIS — R7303 Prediabetes: Secondary | ICD-10-CM | POA: Insufficient documentation

## 2018-01-08 ENCOUNTER — Other Ambulatory Visit: Payer: Self-pay | Admitting: Urgent Care

## 2018-01-08 DIAGNOSIS — I10 Essential (primary) hypertension: Secondary | ICD-10-CM

## 2018-01-08 DIAGNOSIS — R030 Elevated blood-pressure reading, without diagnosis of hypertension: Secondary | ICD-10-CM

## 2018-01-09 ENCOUNTER — Other Ambulatory Visit: Payer: Self-pay | Admitting: Family Medicine

## 2018-01-10 NOTE — Telephone Encounter (Signed)
Patient called, left VM to call the office back to schedule an f/u appointment this month.  Last OV:10/08/17 Last filled: 10/08/17 ZOX:[id number]:[facility] [address] - [place], [place] - [id number] [phone] (Phone) [phone] (Fax)

## 2018-01-13 ENCOUNTER — Ambulatory Visit: Payer: Self-pay | Admitting: Family Medicine

## 2018-11-10 ENCOUNTER — Ambulatory Visit: Payer: Self-pay | Admitting: Family Medicine

## 2018-12-19 ENCOUNTER — Ambulatory Visit: Payer: Self-pay | Admitting: Family Medicine

## 2019-02-06 ENCOUNTER — Encounter: Payer: Self-pay | Admitting: Family Medicine

## 2019-02-06 ENCOUNTER — Other Ambulatory Visit: Payer: Self-pay

## 2019-02-06 ENCOUNTER — Ambulatory Visit: Payer: Self-pay | Attending: Family Medicine | Admitting: Family Medicine

## 2019-02-06 ENCOUNTER — Other Ambulatory Visit (HOSPITAL_COMMUNITY): Payer: Self-pay | Admitting: *Deleted

## 2019-02-06 ENCOUNTER — Ambulatory Visit: Payer: Self-pay | Admitting: Family Medicine

## 2019-02-06 VITALS — BP 194/94 | HR 65 | Temp 98.2°F | Ht 62.0 in | Wt 161.8 lb

## 2019-02-06 DIAGNOSIS — R7303 Prediabetes: Secondary | ICD-10-CM

## 2019-02-06 DIAGNOSIS — G8929 Other chronic pain: Secondary | ICD-10-CM

## 2019-02-06 DIAGNOSIS — I10 Essential (primary) hypertension: Secondary | ICD-10-CM

## 2019-02-06 DIAGNOSIS — F5104 Psychophysiologic insomnia: Secondary | ICD-10-CM

## 2019-02-06 DIAGNOSIS — M778 Other enthesopathies, not elsewhere classified: Secondary | ICD-10-CM

## 2019-02-06 DIAGNOSIS — N644 Mastodynia: Secondary | ICD-10-CM

## 2019-02-06 DIAGNOSIS — M25561 Pain in right knee: Secondary | ICD-10-CM

## 2019-02-06 DIAGNOSIS — Z1239 Encounter for other screening for malignant neoplasm of breast: Secondary | ICD-10-CM

## 2019-02-06 DIAGNOSIS — M7582 Other shoulder lesions, left shoulder: Secondary | ICD-10-CM

## 2019-02-06 LAB — POCT GLYCOSYLATED HEMOGLOBIN (HGB A1C): Hemoglobin A1C: 5.9 % — AB (ref 4.0–5.6)

## 2019-02-06 MED ORDER — TRAZODONE HCL 50 MG PO TABS: 25.0000 mg | ORAL_TABLET | Freq: Every evening | ORAL | 3 refills | Status: DC | PRN

## 2019-02-06 MED ORDER — CHLORTHALIDONE 25 MG PO TABS: 25.0000 mg | ORAL_TABLET | Freq: Every day | ORAL | 1 refills | Status: DC

## 2019-02-06 MED ORDER — MELOXICAM 7.5 MG PO TABS: ORAL_TABLET | ORAL | 2 refills | Status: DC

## 2019-02-06 MED ORDER — LISINOPRIL 20 MG PO TABS: 20.0000 mg | ORAL_TABLET | Freq: Every day | ORAL | 1 refills | Status: DC

## 2019-02-06 MED FILL — CHLORTHALIDONE 25 MG TAB: 25 | 30 days supply | Qty: 30 | Fill #0

## 2019-02-06 MED FILL — MELOXICAM 7.5 MG TABLET: 7.5 | 30 days supply | Qty: 60 | Fill #0

## 2019-02-06 MED FILL — traZODone HCL 50 MG TABS: 50 | 30 days supply | Qty: 30 | Fill #0

## 2019-02-06 MED FILL — LISINOPRIL 20 MG TAB: 20 | 30 days supply | Qty: 30 | Fill #0

## 2019-02-06 NOTE — Progress Notes (Signed)
New Patient Office Visit  Subjective:  Patient ID: Shelia [name], female    DOB: [birth year]  Age: 63 y.o. MRN: [id number]   Due to a language barrier, Stratus video interpretation system used at today's visit  CC:  Chief Complaint  Patient presents with  . New Patient (Initial Visit)    HPI Shelia [name] presents to establish care.  Patient initially with complaint of greater than 3 months of pain in her left shoulder and right knee.  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  She reports that the shoulder pain is slightly better since she is no longer working in [employer] however she continues to do very intensive work.  She realizes that her shoulder has probably been hurting off and on more than just for the past 3 months.  Patient reports a dull, aching sensation in both the right knee as well as the left shoulder and discomfort is made greater by movement.  Left arm pain/shoulder pain is about a 6 on a 0-to-10 scale and knee pain can range from a 3-4 to a 6 or 7 depending on activity level.  She denies any numbness or tingling in her fingertips or feet.  She has taken medication in the past which did help with her joint pain.      Patient was made aware that her blood pressure was very elevated at today's visit and that per her prior records she was on blood pressure medication in the past.  She states that she has not taken blood pressure medication for more than 3 months after running out of her last prescription.  She has had some occasional dull, bitemporal headaches that about a 2-3 on a 0-to-10 scale.  No dizziness, no focal numbness or weakness, no syncopal episodes.  Patient was also made aware of prior diagnosis of prediabetes about 2 years ago.  She denies any  increased thirst, no urinary frequency and no blurred vision or double vision.  No numbness in the hands or feet suggestive of diabetes.       She does report chronic issues with difficulty sleeping.  She has difficulty both falling asleep and staying asleep.  She has recently been taking over-the-counter melatonin 5 mg but states that this has not really made a difference in her sleep.  She would like to have a prescription medication to see if this would help improve her sleep.  She does not believe that she snores.  She does have some daytime fatigue but she believes that this is due to lack of sleep as well as her demanding job.  Past Medical History:  Diagnosis Date  . Hypertension     Past Surgical History:  Procedure Laterality Date  . NO PAST SURGERIES      Family History  Problem Relation Age of Onset  . Diabetes Father   . Hypertension Neg Hx   . CAD Neg Hx   . Cancer Neg Hx     Social History   Tobacco Use  . Smoking status: Never Smoker  . Smokeless tobacco: Never Used  Substance Use Topics  . Alcohol use: No  . Drug use: No    ROS Review of Systems  Constitutional: Positive for fatigue. Negative for chills and fever.  HENT: Negative for  nosebleeds, sore throat and trouble swallowing.   Eyes: Negative for photophobia and visual disturbance.  Respiratory: Negative for cough and shortness of breath.   Cardiovascular: Positive for leg swelling (chronic issues with LE edema right greater than left which improves overnight). Negative for chest pain and palpitations.  Gastrointestinal: Negative for abdominal pain, blood in stool, constipation, diarrhea and nausea.  Endocrine: Negative for cold intolerance, heat intolerance, polydipsia, polyphagia and polyuria.  Genitourinary: Negative for dysuria and frequency.  Musculoskeletal: Positive for arthralgias. Negative for back pain.  Neurological: Positive for headaches. Negative for dizziness.  Hematological: Negative for  adenopathy. Does not bruise/bleed easily.  Psychiatric/Behavioral: Positive for sleep disturbance. Negative for suicidal ideas. The patient is not nervous/anxious.     Objective:   Today's Vitals: BP (!) 194/94 (BP Location: Left Arm, Patient Position: Sitting, Cuff Size: Large)   Pulse 65   Temp 98.2 F (36.8 C) (Oral)   Ht 5\' 2"  (1.575 m)   Wt 161 lb 12.8 oz (73.4 kg)   SpO2 98%   BMI 29.59 kg/m   Physical Exam Vitals signs and nursing note reviewed.  Constitutional:      General: She is not in acute distress.    Appearance: Normal appearance. She is not ill-appearing.  Neck:     Musculoskeletal: Normal range of motion and neck supple. No muscular tenderness.  Cardiovascular:     Rate and Rhythm: Normal rate and regular rhythm.  Pulmonary:     Effort: Pulmonary effort is normal.     Breath sounds: Normal breath sounds.  Abdominal:     Palpations: Abdomen is soft.     Tenderness: There is no abdominal tenderness. There is no right CVA tenderness, left CVA tenderness, guarding or rebound.  Musculoskeletal:        General: Tenderness (left shoulder discomfort with empty can manuver; crepitus of the right knee; mild tenderness over the mid to lower patella) present. No deformity.     Right lower leg: Edema present.     Left lower leg: Edema present.     Comments: Mild, non-pitting distal LE edema  Lymphadenopathy:     Cervical: No cervical adenopathy.  Skin:    General: Skin is warm and dry.  Neurological:     General: No focal deficit present.     Mental Status: She is alert and oriented to person, place, and time.     Cranial Nerves: No cranial nerve deficit.  Psychiatric:        Mood and Affect: Mood normal.        Behavior: Behavior normal.        Thought Content: Thought content normal.     Assessment & Plan:  1. Essential hypertension Discussed with patient that her blood pressure was very high at today's visit.  She agrees to restart lisinopril 20 mg and  chlorthalidone 25 mg which she has taken in the past without problems.  She has been asked to return to clinic in the next 1 to 2 weeks for blood pressure recheck.  She may also have blood pressure checked at her local pharmacy or fire station.  Importance of daily compliance with blood pressure medication stressed to the patient  - lisinopril (ZESTRIL) 20 MG tablet; Take 1 tablet (20 mg total) by mouth daily. To lower blood pressure  Dispense: 90 tablet; Refill: 1 - chlorthalidone (HYGROTON) 25 MG tablet; Take 1 tablet (25 mg total) by mouth daily. To lower blood pressure  Dispense: 90 tablet; Refill: 1  2. Prediabetes Patient has had prior hemoglobin A1c which was elevated at 6.2 in October 2018.  She denies any symptoms suggestive of elevated blood sugar/diabetes.  Hemoglobin A1c was repeated at today's visit and was improved at 5.9 but still consistent with prediabetes and low carbohydrate diet and regular low impact cardiovascular exercise recommended. - HgB A1c  3. Tendinitis of left shoulder Based on examination, patient likely with rotator cuff tendinitis of the left shoulder as well as some arthritis or patellar tendinitis of the knee.  Patient has taken meloxicam in the past and new prescription provided for meloxicam 7.5 mg but she may take 1 to 2 tablets 1 time daily to help with pain.  If blood pressure remains elevated, medication for pain may need to be changed. - meloxicam (MOBIC) 7.5 MG tablet; One or two pills once per day as needed for joint pain; take after eating  Dispense: 60 tablet; Refill: 2  4. Chronic pain of right knee Based on her exam, she may have issues with chondromalacia, patellar tendinitis or most likely osteoarthritis.  Prescription refilled for Mobic which patient has taken in the past but she may take up to 2 pills once daily if needed. - meloxicam (MOBIC) 7.5 MG tablet; One or two pills once per day as needed for joint pain; take after eating  Dispense: 60 tablet;  Refill: 2  5. Screening for breast cancer Patient has not had a mammogram in the past 2 years and information given to the patient to apply for scholarship to obtain screening mammogram - MM Digital Screening; Future  6. Chronic insomnia Patient with complaint of issues with chronic insomnia and prescription provided for trazodone 50 mg to take one half or 1 pill once daily as needed at bedtime and sleep hygiene reviewed - traZODone (DESYREL) 50 MG tablet; Take 0.5-1 tablets (25-50 mg total) by mouth at bedtime as needed for sleep.  Dispense: 30 tablet; Refill: 3   Outpatient Encounter Medications as of 02/06/2019  Medication Sig  . chlorthalidone (HYGROTON) 25 MG tablet Take 1 tablet (25 mg total) by mouth daily. To lower blood pressure  . lisinopril (ZESTRIL) 20 MG tablet Take 1 tablet (20 mg total) by mouth daily. To lower blood pressure  . meloxicam (MOBIC) 7.5 MG tablet One or two pills once per day as needed for joint pain; take after eating  . traZODone (DESYREL) 50 MG tablet Take 0.5-1 tablets (25-50 mg total) by mouth at bedtime as needed for sleep.  . [DISCONTINUED] chlorthalidone (HYGROTON) 25 MG tablet Take 1 tablet (25 mg total) by mouth daily. (Patient not taking: Reported on 02/06/2019)  . [DISCONTINUED] lisinopril (PRINIVIL,ZESTRIL) 20 MG tablet Take 1 tablet (20 mg total) by mouth daily. (Patient not taking: Reported on 02/06/2019)  . [DISCONTINUED] meloxicam (MOBIC) 7.5 MG tablet Take 1 tablet (7.5 mg total) by mouth at bedtime. (Patient not taking: Reported on 02/06/2019)   No facility-administered encounter medications on file as of 02/06/2019.     Follow-up: Return in about 2 weeks (around 02/20/2019) for HTN-f/u in 2-3 weeks.  Cain Saupe, MD

## 2019-02-06 NOTE — Patient Instructions (Addendum)
Please restart your blood pressure medications as your blood pressure was very high at today's visit. Hipertensin Hypertension Introduccin El trmino hipertensin es otra forma de denominar a la presin arterial elevada. La presin arterial elevada fuerza al corazn a trabajar ms para bombear la sangre. Esto puede causar problemas con el paso del [place]. Una lectura de presin arterial est compuesta por 2 nmeros. Hay un nmero superior (sistlico) sobre un nmero inferior (diastlico). Lo ideal es tener la presin arterial por debajo de 120/80. Las decisiones saludables pueden ayudarle a disminuir su presin arterial. Es posible que necesite medicamentos que le ayuden a disminuir su presin arterial si:  Su presin arterial no disminuye mediante decisiones saludables.  Su presin arterial est por encima de 130/80. Siga estas instrucciones en su casa: Comida y bebida   Si se lo indican, siga el plan de alimentacin de DASH (Dietary Approaches to Stop Hypertension, Maneras de alimentarse para detener la hipertensin). Esta dieta incluye: ? Que la mitad del plato de cada comida sea de frutas y verduras. ? Que un cuarto del plato de cada comida sea de cereales integrales. Los cereales integrales incluyen pasta integral, arroz integral y pan integral. ? Comer y beber productos lcteos con bajo contenido de [place], como leche descremada o yogur bajo en grasas. ? Que un cuarto del plato de cada comida sea de protenas bajas en grasa (magras). Las protenas bajas en grasa incluyen pescado, pollo sin piel, huevos, frijoles y tofu. ? Evitar consumir carne grasa, carne curada y procesada, o pollo con piel. ? Evitar consumir alimentos prehechos o procesados.  Consuma menos de 1500 mg de sal (sodio) por da.  Limite el consumo de alcohol a no ms de 1 medida por da si es mujer y no est Orthoptistembarazada y a 2 medidas por da si es hombre. Una medida equivale a 12onzas de cerveza, 5onzas de vino o  1onzas de bebidas alcohlicas de alta graduacin. Estilo de vida  Trabaje con su mdico para mantenerse en un peso saludable o para perder peso. Pregntele a su mdico cul es el peso recomendable para usted.  Realice al menos 30 minutos de ejercicio que haga que se acelere su corazn (ejercicio Magazine features editoraerbico) la DIRECTVmayora de los das de la [place]. Estos pueden incluir caminar, nadar o andar en bicicleta.  Realice al menos 30 minutos de ejercicio que fortalezca sus msculos (ejercicios de resistencia) al menos 3 das a la [place]mana. Estos pueden incluir levantar pesas o hacer pilates.  No consuma ningn producto que contenga nicotina o tabaco. Esto incluye cigarrillos y cigarrillos electrnicos. Si necesita ayuda para dejar de fumar, consulte al American Expressmdico.  Controle su presin arterial en su casa tal como le indic el mdico.  Concurra a todas las visitas de control como se lo haya indicado el mdico. Esto es importante. Medicamentos  Baxter Internationalome los medicamentos de venta libre y los recetados solamente como se lo haya indicado el mdico. Siga cuidadosamente las indicaciones.  No omita las dosis de medicamentos para la presin arterial. Los medicamentos pierden eficacia si omite dosis. El hecho de omitir las dosis tambin [place]umenta el riesgo de otros problemas.  Pregntele a su mdico a qu efectos secundarios o reacciones a los Museum/gallery curatormedicamentos debe prestar atencin. Comunquese con un mdico si:  Piensa que tiene [place] reaccin a los medicamentos que est tomando.  Tiene dolores de cabeza frecuentes (recurrentes).  Siente mareos.  Tiene hinchazn en los tobillos.  Tiene problemas de visin. Solicite ayuda de inmediato si:  Siente un  dolor de cabeza muy intenso.  Comienza a sentirse confundido.  Se siente dbil o adormecido.  Siente que va a desmayarse.  Siente un dolor muy intenso en: ? El pecho. ? El vientre (abdomen).  Devuelve (vomita) ms de una vez.  Tiene dificultad para  respirar. Resumen  El trmino hipertensin es otra forma de denominar a la presin arterial elevada.  Las decisiones saludables pueden ayudarle a disminuir su presin arterial. Si no puede controlar su presin arterial mediante decisiones saludables, es posible que deba tomar medicamentos. Esta informacin no tiene Theme park manager el consejo del mdico. Asegrese de hacerle al mdico cualquier pregunta que tenga. Document Released: 02/25/2010 Document Revised: 08/19/2016 Document Reviewed: 08/19/2016 Elsevier Interactive Patient Education  2019 [employer].  Plan de alimentacin DASH DASH Eating Plan DASH es la sigla en ingls de "Enfoques Alimentarios para Detener la Hipertensin" (Dietary Approaches to Stop Hypertension). El plan de alimentacin DASH ha demostrado bajar la presin arterial elevada (hipertensin). Tambin puede reducir Lex[employer] International de diabetes tipo 2, enfermedad cardaca y accidente cerebrovascular. Este plan tambin puede ayudar a Geophysical data processor. Consejos para seguir este plan  Pautas generales  Evite ingerir ms de 2,300 mg (miligramos) de sal (sodio) por da. Si tiene hipertensin, es posible que necesite reducir la ingesta de sodio a 1,500 mg por da.  Limite el consumo de alcohol a no ms de por da si es mujer y no est Custar, y por da si es hombre. Una medida equivale a 12oz ( ) de cerveza, 5oz ( ) de vino o 1oz (59ml) de bebidas alcohlicas de alta graduacin.  Trabaje con su mdico para mantener un peso saludable o perder [employer]. Pregntele cul es el peso recomendado para usted.  Realice al menos 30 minutos de ejercicio que haga que se acelere su corazn (ejercicio Magazine features editor) la DIRECTV de la [place]. Estas actividades pueden incluir caminar, nadar o andar en bicicleta.  Trabaje con su mdico o especialista en alimentacin y nutricin (nutricionista) para ajustar su plan alimentario a sus necesidades calricas  personales. Lectura de las etiquetas de los alimentos   Verifique en las etiquetas de los alimentos, la cantidad de sodio por porcin. Elija alimentos con menos del 5 por ciento del valor diario de sodio. Generalmente, los alimentos con menos de 300 mg de sodio por porcin se encuadran dentro de este plan alimentario.  Para encontrar cereales integrales, busque la palabra "integral" como primera palabra en la lista de ingredientes. De compras  Compre productos en los que en su etiqueta diga: "bajo contenido de sodio" o "sin agregado de sal".  Compre alimentos frescos. Evite los alimentos enlatados y comidas precocidas o congeladas. Coccin  Evite agregar sal cuando cocine. Use hierbas o aderezos sin sal, en lugar de sal de mesa o sal marina. Consulte al mdico o farmacutico antes de usar sustitutos de la sal.  No fra los alimentos. A la hora de cocinar los alimentos opte por hornearlos, hervirlos, grillarlos y asarlos a Patent attorney.  Cocine con aceites cardiosaludables, como oliva, canola, soja o girasol. Planificacin de las comidas  Consuma una dieta equilibrada, que incluya lo siguiente: ? 5o ms porciones de frutas y Warehouse manager. Trate de que la mitad del plato de cada comida sean frutas y verduras. ? Hasta 6 u 8 porciones de cereales integrales por da. ? Menos de 6 onzas de carne, aves o pescado Copy. Una porcin de 3 onzas de carne tiene casi el mismo tamao que un  mazo de cartas. Un huevo equivale a 1 onza. ? Dos porciones de productos lcteos descremados por Futures trader. ? Una porcin de frutos secos, semillas o frijoles 5 veces por semana. ? Grasas cardiosaludables. Las grasas saludables llamadas cidos grasos omega-3 se encuentran en alimentos como semillas de lino y pescados de agua fra, como por ejemplo, sardinas, salmn y caballa.  Limite la cantidad que ingiere de los siguientes alimentos: ? Alimentos enlatados o envasados. ? Alimentos con alto contenido de  grasa trans, como alimentos fritos. ? Alimentos con alto contenido de grasa saturada, como carne con grasa. ? Dulces, postres, bebidas azucaradas y otros alimentos con azcar agregada. ? Productos lcteos enteros.  No le agregue sal a los alimentos antes de probarlos.  Trate de comer al menos 2 comidas vegetarianas por semana.  Consuma ms comida casera y menos de restaurante, de bufs y comida rpida.  Cuando coma en un restaurante, pida que preparen su comida con menos sal o, en lo posible, sin nada de sal. Qu alimentos se recomiendan? Los alimentos enumerados a continuacin no constituyen Water quality scientist. Hable con el nutricionista sobre las mejores opciones alimenticias para usted. Cereales Pan de salvado o integral. Pasta de salvado o integral. Arroz integral. Avena. Quinua. Trigo burgol. Cereales integrales y con bajo contenido de [place]. Pan pita. Galletitas de [place] con bajo contenido de [place] y [place]. Tortillas de [place] integral. Verduras Verduras frescas o congeladas (crudas, al vapor, asadas o grilladas). Jugos de tomate y verduras con bajo contenido de sodio o reducidos en sodio. Salsa y pasta de tomate con bajo contenido de sodio o reducidas en sodio. Verduras enlatadas con bajo contenido de sodio o reducidas en sodio. Frutas Todas las frutas frescas, congeladas o disecadas. Frutas enlatadas en jugo natural (sin agregado de azcar). Carne y otros alimentos proteicos Pollo o pavo sin piel. Carne de pollo o de [place]. Cerdo desgrasado. Pescado y [employer]. Claras de huevo. Porotos, guisantes o lentejas secos. Frutos secos, mantequilla de frutos secos y semillas sin sal. Frijoles enlatados sin sal. Cortes de carne vacuna magra, desgrasada. Embutidos magros, con bajo contenido de [place]. Lcteos Leche descremada (1%) o descremada. Quesos sin grasa, con bajo contenido de grasa o descremados. Queso blanco o ricota sin grasa, con bajo contenido de [place]. Yogur semidescremado o  descremado. Queso con bajo contenido de [place] y [place]. Grasas y [employer] untables que no contengan grasas trans. Aceite vegetal. Jerolyn Shin y aderezos para ensaladas livianos o con bajo contenido de grasas (reducidos en sodio). Aceite de canola, crtamo, oliva, soja y [place]. [place]. Condimentos y otros alimentos Hierbas. Especias. Mezclas de condimentos sin sal. Palomitas de maz y pretzels sin sal. Dulces con bajo contenido de grasas. Qu alimentos no se recomiendan? Los alimentos enumerados a continuacin no constituyen Water quality scientist. Hable con el nutricionista sobre las mejores opciones alimenticias para usted. Cereales Productos de panificacin hechos con grasa, como medialunas, magdalenas y algunos panes. Comidas con arroz o pasta seca listas para usar. Verduras Verduras con crema o fritas. Verduras en salsa de [place]. Verduras enlatadas regulares (que no sean con bajo contenido de sodio o reducidas en sodio). Pasta y salsa de tomates enlatadas regulares (que no sean con bajo contenido de sodio o reducidas en sodio). Jugos de tomate y verduras regulares (que no sean con bajo contenido de sodio o reducidos en sodio). Pepinillos. Aceitunas. Nils Pyle Fruta enlatada en almbar liviano o espeso. Frutas cocidas en aceite. Frutas con salsa de crema o [place]. Carne y otros alimentos proteicos  Cortes de carne con grasa. Costillas. Carne frita. [place]. Salchichas. Mortadela y otras carnes procesadas. Salame. Panceta. Perros calientes (hotdogs). Salchicha de cerdo. Frutos secos y semillas con sal. Frijoles enlatados con agregado de sal. Pescado enlatado o ahumado. Huevos enteros o yemas. Pollo o pavo con piel. Lcteos Leche entera o al 2%, crema y [id number] y mitad crema. Queso crema entero o con toda su grasa. Yogur entero o endulzado. Quesos con toda su grasa. Sustitutos de cremas no lcteas. Coberturas batidas. Quesos para untar y quesos procesados. Grasas y [employer].  Margarina en barra. Manteca de cerdo. Materia grasa. Mantequilla clarificada. Grasa de panceta. Aceites tropicales como aceite de coco, palmiste o palma. Condimentos y otros alimentos Palomitas de maz y pretzels con sal. Sal de cebolla, sal de ajo, sal condimentada, sal de mesa y sal marina. Salsa Worcestershire. Salsa trtara. Salsa barbacoa. Salsa teriyaki. Salsa de soja, incluso la que tiene contenido reducido de [place]. Salsa de carne. Salsas en lata y envasadas. Salsa de pescado. Salsa de [place]. Salsa rosada. Rbano picante envasado. [place]. [place]aza. Saborizantes y tiernizantes para carne. Caldo en cubitos. Salsa picante y salsa tabasco. Escabeches envasados o ya preparados. Aderezos para tacos prefabricados o envasados. Salsas. Aderezos comunes para ensalada. Dnde encontrar ms informacin:  [employer] del [address], los Pulmones y Risk manager (National Heart, Lung, and Blood Institute): PopSteam.is  Asociacin Estadounidense del Corazn (American Heart Association): www.heart.org Resumen  El plan de alimentacin DASH ha demostrado bajar la presin arterial elevada (hipertensin). Tambin puede reducir Lexmark International de diabetes tipo 2, enfermedad cardaca y accidente cerebrovascular.  Con el plan de alimentacin DASH, deber limitar el consumo de sal (sodio) a 2,300 mg por da. Si tiene hipertensin, es posible que necesite reducir la ingesta de sodio a 1,500 mg por da.  Cuando siga el plan de alimentacin DASH, trate de comer ms frutas frescas y verduras, cereales integrales, carnes magras, lcteos descremados y grasas cardiosaludables.  Trabaje con su mdico o especialista en alimentacin y nutricin (nutricionista) para ajustar su plan alimentario a sus necesidades calricas personales. Esta informacin no tiene Theme park manager el consejo del mdico. Asegrese de hacerle al mdico cualquier pregunta que tenga. Document Released: 08/27/2011 Document Revised: 12/28/2016  Document Reviewed: 12/28/2016 Elsevier Interactive Patient Education  2019 [employer].

## 2019-02-06 NOTE — Progress Notes (Signed)
New Patient is having pain in her shoulder and knees,   Per pt she is not taking any medications other then a pain med that she buys over the counter and she do not remember the name

## 2019-02-08 ENCOUNTER — Other Ambulatory Visit (HOSPITAL_COMMUNITY): Payer: Self-pay | Admitting: *Deleted

## 2019-02-08 DIAGNOSIS — N644 Mastodynia: Secondary | ICD-10-CM

## 2019-02-16 ENCOUNTER — Other Ambulatory Visit: Payer: Self-pay

## 2019-02-16 ENCOUNTER — Encounter (HOSPITAL_COMMUNITY): Payer: Self-pay

## 2019-02-16 ENCOUNTER — Ambulatory Visit (HOSPITAL_COMMUNITY)
Admission: RE | Admit: 2019-02-16 | Discharge: 2019-02-16 | Disposition: A | Discharge status: Home / Self Care | Payer: Self-pay | Source: Ambulatory Visit | Attending: Obstetrics and Gynecology | Admitting: Obstetrics and Gynecology

## 2019-02-16 VITALS — BP 122/78 | Temp 98.0°F | Wt 162.0 lb

## 2019-02-16 DIAGNOSIS — N644 Mastodynia: Secondary | ICD-10-CM

## 2019-02-16 DIAGNOSIS — Z1239 Encounter for other screening for malignant neoplasm of breast: Secondary | ICD-10-CM

## 2019-02-16 DIAGNOSIS — N6311 Unspecified lump in the right breast, upper outer quadrant: Secondary | ICD-10-CM

## 2019-02-16 NOTE — Patient Instructions (Signed)
Explained breast self awareness with [name]. Patient did not need a Pap smear today due to last Pap smear was 06/18/2017. Let her know [facility] will cover Pap smears every 3 years unless has a history of abnormal Pap smears. [facility]erred [facility] to [facility] for a diagnostic mammogram and possible right breast ultrasound. Appointment scheduled for Thursday, February 23, 2019 at 1250. Patient aware of appointment and will be there. Shalita Galindo-Carreon verbalized understanding.  Jasime Westergren, Kathaleen Maser, RN 3:07 PM

## 2019-02-16 NOTE — Progress Notes (Signed)
Complaints of right upper inner breast pain x 5 weeks that comes and goes. Patient rates the pain at a 4 out of 10.  Pap Smear: Pap smear not completed today. Last Pap smear was 06/18/2017 at Dr. Adela Glimpse office and normal. Per patient has no history of an abnormal Pap smear. Last Pap smear result is in Epic.  Physical exam: Breasts Breasts symmetrical. No skin abnormalities bilateral breasts. No nipple retraction bilateral breasts. No nipple discharge bilateral breasts. No lymphadenopathy. No lumps palpated left breast. Palpated a lump within the right breast at 10 o'clock 7 cm from the nipple. Complaints of left breast tenderness around nipple area on exam. Referred patient to the [facility] for a diagnostic mammogram and possible right breast ultrasound. Appointment scheduled for Thursday, February 23, 2019 at 1250.        Pelvic/Bimanual No Pap smear completed today since last Pap smear was 06/18/2017. Pap smear not indicated per BCCCP guidelines.   Smoking History: Patient has never smoked.  Patient Navigation: Patient education provided. Access to services provided for patient through [facility] program. Spanish interpreter provided.   Colorectal Cancer Screening: Per patient has never had a colonoscopy completed. No complaints today.   Breast and Cervical Cancer Risk Assessment: Patient has no family history of breast cancer, known genetic mutations, or radiation treatment to the chest before age 48. Patient has no history of cervical dysplasia, immunocompromised, or DES exposure in-utero.  Risk Assessment    Risk Scores      02/16/2019   Last edited by: Lynnell Dike, LPN   5-year risk: 0.9 %   Lifetime risk: 4.3 %         Used Spanish interpreter Natale Lay from [place].

## 2019-02-20 ENCOUNTER — Encounter (HOSPITAL_COMMUNITY): Payer: Self-pay | Admitting: *Deleted

## 2019-02-23 ENCOUNTER — Other Ambulatory Visit: Payer: Self-pay

## 2019-02-23 ENCOUNTER — Ambulatory Visit
Admission: RE | Admit: 2019-02-23 | Discharge: 2019-02-23 | Disposition: A | Discharge status: Home / Self Care | Payer: No Typology Code available for payment source | Source: Ambulatory Visit | Attending: Obstetrics and Gynecology | Admitting: Obstetrics and Gynecology

## 2019-02-23 ENCOUNTER — Other Ambulatory Visit (HOSPITAL_COMMUNITY): Payer: Self-pay | Admitting: Obstetrics and Gynecology

## 2019-02-23 DIAGNOSIS — N644 Mastodynia: Secondary | ICD-10-CM

## 2019-03-01 ENCOUNTER — Encounter: Payer: Self-pay | Admitting: Family Medicine

## 2019-03-01 ENCOUNTER — Other Ambulatory Visit: Payer: Self-pay

## 2019-03-01 ENCOUNTER — Ambulatory Visit: Payer: Self-pay | Attending: Family Medicine | Admitting: Family Medicine

## 2019-03-01 VITALS — BP 116/73 | HR 73 | Temp 98.3°F | Ht 62.0 in | Wt 166.2 lb

## 2019-03-01 DIAGNOSIS — I10 Essential (primary) hypertension: Secondary | ICD-10-CM

## 2019-03-01 DIAGNOSIS — Z79899 Other long term (current) drug therapy: Secondary | ICD-10-CM

## 2019-03-01 MED FILL — LISINOPRIL 20 MG TABLET: 20 | 30 days supply | Qty: 30 | Fill #1

## 2019-03-01 MED FILL — MELOXICAM 7.5 MG TABLET: 7.5 | 30 days supply | Qty: 60 | Fill #1

## 2019-03-01 MED FILL — CHLORTHALIDONE 25 MG TAB: 25 | 30 days supply | Qty: 30 | Fill #1

## 2019-03-01 NOTE — Progress Notes (Signed)
Established Patient Office Visit  Subjective:  Patient ID: Shelia [name], female    DOB: 1956/03/09  Age: 63 y.o. MRN: [id number]  CC:  Chief Complaint  Patient presents with  . Hypertension    HPI Shelia [name] presents for follow-up of hypertension. She reports that she has been taking the medications, chlorthalidone and lisinopril without any issues. She feels better. Occasional mild dizziness. No chest pain or palpitations, no headaches. Her work is strenuous and she tends to sweat and she is not sure if this is contributing to her dizziness. She denies swelling in her legs but had noticed that the right ankle has been bigger than the left for a few years and she is not sure why. No prior injury to the right ankle. She would also like to see an eye specialist and a dentist.  Past Medical History:  Diagnosis Date  . Hypertension     Past Surgical History:  Procedure Laterality Date  . NO PAST SURGERIES      Family History  Problem Relation Age of Onset  . Diabetes Father   . Hypertension Neg Hx   . CAD Neg Hx   . Cancer Neg Hx     Social History   Tobacco Use  . Smoking status: Never Smoker  . Smokeless tobacco: Never Used  Substance Use Topics  . Alcohol use: No  . Drug use: No    Outpatient Medications Prior to Visit  Medication Sig Dispense Refill  . chlorthalidone (HYGROTON) 25 MG tablet Take 1 tablet (25 mg total) by mouth daily. To lower blood pressure 90 tablet 1  . lisinopril (ZESTRIL) 20 MG tablet Take 1 tablet (20 mg total) by mouth daily. To lower blood pressure 90 tablet 1  . meloxicam (MOBIC) 7.5 MG tablet One or two pills once per day as needed for joint pain; take after eating 60 tablet 2  . traZODone (DESYREL) 50 MG tablet Take 0.5-1 tablets (25-50 mg total) by mouth at bedtime as needed for sleep. (Patient not taking: Reported on 02/16/2019) 30 tablet 3   No facility-administered medications prior to visit.     No Known  Allergies  ROS Review of Systems  Constitutional: Negative for chills and fatigue.  HENT: Negative for ear pain, sore throat and trouble swallowing.   Eyes: Negative for photophobia and visual disturbance.  Respiratory: Negative for cough and shortness of breath.   Cardiovascular: Negative for chest pain, palpitations and leg swelling.  Gastrointestinal: Negative for abdominal pain, constipation, diarrhea and nausea.  Endocrine: Negative for cold intolerance, heat intolerance, polydipsia, polyphagia and polyuria.  Genitourinary: Negative for dysuria and frequency.  Musculoskeletal: Positive for arthralgias (joint pain is tolerable). Negative for joint swelling.  Neurological: Positive for dizziness (mild). Negative for headaches.  Hematological: Negative for adenopathy. Does not bruise/bleed easily.      Objective:    Physical Exam  Constitutional: She is oriented to person, place, and time. She appears well-developed and well-nourished.  Neck: Normal range of motion. Neck supple.  Cardiovascular: Normal rate and regular rhythm.  Pulmonary/Chest: Effort normal and breath sounds normal.  Abdominal: Soft. There is no abdominal tenderness. There is no rebound.  Musculoskeletal:        General: No tenderness or edema.  Neurological: She is alert and oriented to person, place, and time. No cranial nerve deficit.  Skin: Skin is warm and dry.  Lipoma on the right medial ankle/top of foot (soft compressible tissue mass)  Psychiatric: She has a normal  mood and affect. Her behavior is normal.  Nursing note and vitals reviewed.   BP 116/73 (BP Location: Right Arm, Patient Position: Sitting, Cuff Size: Large)   Pulse 73   Temp 98.3 F (36.8 C) (Oral)   Ht 5\' 2"  (1.575 m)   Wt 166 lb 3.2 oz (75.4 kg)   SpO2 95%   BMI 30.40 kg/m  Wt Readings from Last 3 Encounters:  02/16/19 162 lb (73.5 kg)  02/06/19 161 lb 12.8 oz (73.4 kg)  10/08/17 152 lb 9.6 oz (69.2 kg)     Health  Maintenance Due  Topic Date Due  . Hepatitis C Screening  04-22-1956  . HIV Screening  08/20/1971  . TETANUS/TDAP  08/20/1975  . COLONOSCOPY  08/19/2006    There are no preventive care reminders to display for this patient.  Lab Results  Component Value Date   TSH 5.690 (H) 06/18/2017   Lab Results  Component Value Date   WBC 5.3 06/18/2017   HGB 13.2 06/18/2017   HCT 38.9 06/18/2017   MCV 81 06/18/2017   PLT 272 06/18/2017   Lab Results  Component Value Date   NA 140 06/18/2017   K 4.0 06/18/2017   CO2 24 06/18/2017   GLUCOSE 110 (H) 06/18/2017   BUN 17 06/18/2017   CREATININE 0.65 06/18/2017   BILITOT 0.8 06/18/2017   ALKPHOS 113 06/18/2017   AST 31 06/18/2017   ALT 33 (H) 06/18/2017   PROT 7.3 06/18/2017   ALBUMIN 4.3 06/18/2017   CALCIUM 9.0 06/18/2017   Lab Results  Component Value Date   CHOL 199 06/18/2017   Lab Results  Component Value Date   HDL 38 (L) 06/18/2017   Lab Results  Component Value Date   LDLCALC 117 (H) 06/18/2017   Lab Results  Component Value Date   TRIG 222 (H) 06/18/2017   Lab Results  Component Value Date   CHOLHDL 5.2 (H) 06/18/2017   Lab Results  Component Value Date   HGBA1C 5.9 (A) 02/06/2019      Assessment & Plan:  1. Essential hypertension; 2. Long term use of medications Patient's blood pressure is greatly improved after the restart of her medications, lisinopril and chlorthalidone which she will continue. BMP at today's visit as she also reports mild dizziness and she is at risk for dehydration due to the use of a diuretic. Patient with lipoma on the right ankle which is responsible for the appearance of possible swelling. She will also be provided with list of eye specialists and dentist that she may be able to see for a reduced cost.  - Basic Metabolic Panel    Follow-up:Return in about 6 months (around 08/31/2019) for HTN; sooner if any problems or concerns.   Cain Saupeammie Claud Gowan, MD

## 2019-03-01 NOTE — Patient Instructions (Signed)
Lipoma  Lipoma    Un lipoma es un tumor no canceroso (benigno) formado por clulas de grasa. Es un tipo muy frecuente de crecimiento en los tejidos blandos. Por lo general, los lipomas se encuentran debajo de la piel (subcutneos). Pueden aparecer en cualquier tejido del cuerpo que contenga grasa. Las reas en las que los lipomas aparecen con mayor frecuencia incluyen la espalda, los hombros, las nalgas y los muslos.   Los lipomas crecen lentamente y, en general, son indoloros. La mayora de los lipomas no causan problemas y no requieren tratamiento.  Cules son las causas?  Se desconoce la causa de esta afeccin.  Qu incrementa el riesgo?  Es ms probable que usted sufra esta afeccin si:   Tiene entre 40 y 60aos de edad.   Tiene antecedentes familiares de lipomas.  Cules son los signos o los sntomas?  Por lo general, el lipoma aparece como una pequea protuberancia redonda debajo de la piel. En la mayora de los casos, el bulto tiene las siguientes caractersticas:   Se siente suave o elstico.   No causan dolor ni otros sntomas.  Sin embargo, si el lipoma se encuentra en un rea en la que hace presin sobre los nervios, puede causar dolor u otros sntomas.  Cmo se diagnostica?  Por lo general, el lipoma puede diagnosticarse con un examen fsico. Tambin pueden hacerle estudios para confirmar el diagnstico y descartar otras afeccin. Los estudios pueden incluir lo siguiente:   Pruebas de diagnstico por imgenes, como una resonancia magntica (RM) o una exploracin por tomografa computarizada (TC).   Extraccin de una muestra de tejido para analizar con un microscopio (biopsia).  Cmo se trata?  El tratamiento de esta afeccin depende del tamao del lipoma y si causa algn sntoma.   Los lipomas pequeos que no causan problemas no requieren tratamiento.   Si un lipoma se agranda o causa problemas, puede realizarse una ciruga para extirpar el lipoma. Los lipomas tambin pueden extirparse para  mejorar el aspecto. En la mayora de los casos, el procedimiento se realiza despus de aplicar un medicamento que adormece el rea (anestesia local).  Siga estas indicaciones en su casa:   Controle el lipoma para detectar cambios.   Concurra a todas las visitas de control como se lo haya indicado el mdico. Esto es importante.  Comunquese con un mdico si:   El lipoma se agranda o se endurece.   El lipoma comienza a causarle dolor, se enrojece o se hincha cada vez ms. Estos podran ser signos de infeccin o de una afeccin ms grave.  Solicite ayuda de inmediato si:   Siente hormigueo o adormecimiento en el rea cerca del lipoma. Esto podra indicar que el lipoma es lo que causa dao nervioso.  Resumen   Un lipoma es un tumor no canceroso formado por clulas de grasa.   La mayora de los lipomas no causan problemas y no requieren tratamiento.   Si un lipoma se agranda o causa problemas, puede realizarse una ciruga para extirpar el lipoma.  Esta informacin no tiene como fin reemplazar el consejo del mdico. Asegrese de hacerle al mdico cualquier pregunta que tenga.  Document Released: 06/17/2005 Document Revised: 11/01/2017 Document Reviewed: 11/01/2017  Elsevier Interactive Patient Education  2019 Elsevier Inc.

## 2019-03-01 NOTE — Progress Notes (Signed)
HTN   Med Refills

## 2019-03-02 LAB — BASIC METABOLIC PANEL WITH GFR
BUN/Creatinine Ratio: 23 (ref 12–28)
BUN: 27 mg/dL (ref 8–27)
CO2: 24 mmol/L (ref 20–29)
Calcium: 9.1 mg/dL (ref 8.7–10.3)
Chloride: 101 mmol/L (ref 96–106)
Creatinine, Ser: 1.15 mg/dL — ABNORMAL HIGH (ref 0.57–1.00)
GFR calc Af Amer: 59 mL/min/1.73 — ABNORMAL LOW
GFR calc non Af Amer: 51 mL/min/1.73 — ABNORMAL LOW
Glucose: 116 mg/dL — ABNORMAL HIGH (ref 65–99)
Potassium: 3.6 mmol/L (ref 3.5–5.2)
Sodium: 139 mmol/L (ref 134–144)

## 2019-03-08 ENCOUNTER — Other Ambulatory Visit: Payer: Self-pay | Admitting: Obstetrics and Gynecology

## 2019-03-08 ENCOUNTER — Inpatient Hospital Stay: Payer: Self-pay | Attending: Obstetrics and Gynecology | Admitting: *Deleted

## 2019-03-08 ENCOUNTER — Other Ambulatory Visit: Payer: Self-pay

## 2019-03-08 VITALS — BP 146/86 | Temp 98.1°F | Ht 60.5 in | Wt 161.0 lb

## 2019-03-08 DIAGNOSIS — Z0000 Encounter for general adult medical examination without abnormal findings: Secondary | ICD-10-CM

## 2019-03-08 NOTE — Progress Notes (Addendum)
[facility] initial screening   Interpreter- [name], [employer]   Clinical Measurement:  Height: 60.5 in Weight: 161 lb  Blood Pressure: 140/86  Blood Pressure #2: 146/86 Fasting Labs Drawn Today, will review with patient when they result.   Medical History:  Patient states that she has been diagnosed with high blood pressure. Patient states that she has not been diagnosed with diabetes or heart disease.  Medications:  Patients states she is not taking any medications for high cholesterol or diabetes. She is not taking aspirin daily to prevent heart attack or stroke.  Patient states she is taking medicine for high blood pressure.    Blood pressure, self measurement:  Patient states that she does not measure blood pressure at home and has never been told to measure blood pressure.   Nutrition:  Patient states she eats 1 cup of fruit and 2 cups of vegetables in an average day.  Patient states that she eats fish at least two times per week, she eats about half a serving of whole grains daily. She drinks more than 36 ounces of beverages with added sugar weekly.  She is currently watching her sodium intake.  She has not had any drinks containing alcohol in the last seven days.    Physical activity:  Patient states that she gets 120 minutes of moderate exercise in a week.  She gets 0 minutes of vigorous exercise per week.    Smoking status:  Patient states she has never smoked.   Quality of life:  Patient states in the last 2 weeks, she has had 0 days that she has felt down or depressed. She has had 0 days in the last 2 weeks that she has had little interest or pleasure in doing things.   Risk reduction and counseling:  Patients states that she wants to do health coaching and work on her nutrition and diet. We discussed increasing her amount of fruits to 1 1/2 cups a day and increasing her amount of vegetables to 2 1/2 cups a day. We also talked about decreasing the amount of beverages with added  sugars. She is currently drinking on average 1 coke a day. We discussed trying to reduce her soda intake to 3 a week. Spent time showing the patient what recommended portions look like using the food models.    Navigation:  I will notify patient of lab results.  Patient is aware of 2 more health coaching sessions and a follow up.  Time: 45 minutes

## 2019-03-08 NOTE — Addendum Note (Signed)
Addended by: Armond Hang on: 03/08/2019 11:35 AM   Modules accepted: Orders

## 2019-03-14 ENCOUNTER — Ambulatory Visit: Payer: No Typology Code available for payment source

## 2019-03-14 LAB — LIPID PANEL W/O CHOL/HDL RATIO
Cholesterol, Total: 194 mg/dL
HDL: 40 mg/dL (ref >39)
LDL Calculated: 103 mg/dL
Triglycerides: 256 mg/dL
VLDL Cholesterol Cal: 51 mg/dL — ABNORMAL HIGH (ref 5–40)

## 2019-03-16 ENCOUNTER — Telehealth: Payer: Self-pay

## 2019-03-16 NOTE — Telephone Encounter (Signed)
Called patient with interpreter [name] to discuss [name] lab results. Patient stated that she was at work and could not talk. Patient will call back and let us know what day and time will work to go over labs and conduct health coaching #2 session. Gave patient number to call back.

## 2019-03-21 ENCOUNTER — Telehealth: Payer: Self-pay

## 2019-03-21 NOTE — Telephone Encounter (Signed)
Health coaching 2   Interpreter- [place]- 194 cholesterol , 100 LDL cholesterol , 256 triglycerides , 40 HDL cholesterol , hemoglobin A1C (did not draw) , mean plasma glucose (did not draw). Patient understands and is aware of her lab results.   Goals-  Spoke with patient about lab results. Educated patient about diet to lower triglycerides and LDL. Spoke with patient about reducing the amount of red meats that she consumes and whole fat dairy products. Also spoke with the patient about eating foods high in omega-3 fatty acids, such as salmon, mackerel, tuna, walnuts. Goal for patient is to eat 2 servings of fruit and 3 servings of vegetables daily. Also to increase amount of heart health fish to 2 servings per week.     Navigation:  Patient is aware of 1 more health coaching sessions and a follow up.   Time-  10 minutes

## 2019-04-12 MED FILL — CHLORTHALIDONE 25 MG TAB: 25 | 30 days supply | Qty: 30 | Fill #2

## 2019-04-12 MED FILL — LISINOPRIL 20 MG TABLET: 20 | 30 days supply | Qty: 30 | Fill #2

## 2019-05-15 ENCOUNTER — Telehealth: Payer: Self-pay

## 2019-05-15 NOTE — Telephone Encounter (Signed)
Health Coaching 3  interpreter- [name], [facility]    Goals- Patient has increased her amounts of fruit to 2 servings per day. She has also started eating fish 2 times per week as well as reducing the amount of sodas she drinks to 3 sodas per week.    New goal- Patient will try and increase her vegetables servings from 1 cup a day to 3 cups a day. Patient will also try to increase her daily walking from 10 minutes a day to 20 minutes.   Barrier to reaching goal- None for vegetable goal. Patient stated that she walks a lot a work during the day.   Strategies to overcome- I encouraged her to try and continue walking for 10 extra minutes while she is out walking.    Navigation:  Patient is aware of  a follow up session. Will call patient to schedule follow-up visit for October.   Time- 10 minutes

## 2019-07-03 ENCOUNTER — Telehealth: Payer: Self-pay

## 2019-07-03 NOTE — Telephone Encounter (Signed)
Left message for patient via [place] interpreter [phone] about scheduling [facility] FU appointment. Left name and number for patient to call back.

## 2020-05-01 ENCOUNTER — Other Ambulatory Visit: Payer: Self-pay

## 2020-05-01 ENCOUNTER — Encounter: Payer: Self-pay | Admitting: Family Medicine

## 2020-05-01 ENCOUNTER — Ambulatory Visit: Payer: Self-pay | Attending: Family Medicine | Admitting: Family Medicine

## 2020-05-01 VITALS — BP 180/87 | HR 64 | Ht 62.0 in | Wt 165.8 lb

## 2020-05-01 DIAGNOSIS — Z124 Encounter for screening for malignant neoplasm of cervix: Secondary | ICD-10-CM

## 2020-05-01 DIAGNOSIS — Z1159 Encounter for screening for other viral diseases: Secondary | ICD-10-CM

## 2020-05-01 DIAGNOSIS — I10 Essential (primary) hypertension: Secondary | ICD-10-CM

## 2020-05-01 DIAGNOSIS — Z0000 Encounter for general adult medical examination without abnormal findings: Secondary | ICD-10-CM

## 2020-05-01 DIAGNOSIS — Z1231 Encounter for screening mammogram for malignant neoplasm of breast: Secondary | ICD-10-CM

## 2020-05-01 NOTE — Progress Notes (Signed)
Subjective:  Patient ID: Shelia [name], female    DOB: [birth year]  Age: 64 y.o. MRN: [id number]  CC: Annual Exam and Gynecologic Exam   HPI Shelia [name] presents for complete physical exam. She is due for Pap smear, colonoscopy, mammogram. Her blood pressure is elevated and she informs me she is yet to take antihypertensives as she ran out.  Review of her med list indicates she was given a 90-day supply with 1 refill.  Past Medical History:  Diagnosis Date  . Hypertension     Past Surgical History:  Procedure Laterality Date  . NO PAST SURGERIES      Family History  Problem Relation Age of Onset  . Diabetes Father   . Hypertension Neg Hx   . CAD Neg Hx   . Cancer Neg Hx     No Known Allergies  Outpatient Medications Prior to Visit  Medication Sig Dispense Refill  . chlorthalidone (HYGROTON) 25 MG tablet Take 1 tablet (25 mg total) by mouth daily. To lower blood pressure 90 tablet 1  . lisinopril (ZESTRIL) 20 MG tablet Take 1 tablet (20 mg total) by mouth daily. To lower blood pressure 90 tablet 1  . meloxicam (MOBIC) 7.5 MG tablet One or two pills once per day as needed for joint pain; take after eating (Patient not taking: Reported on 05/01/2020) 60 tablet 2   No facility-administered medications prior to visit.     ROS Review of Systems  Constitutional: Negative for activity change, appetite change and fatigue.  HENT: Negative for congestion, sinus pressure and sore throat.   Eyes: Negative for visual disturbance.  Respiratory: Negative for cough, chest tightness, shortness of breath and wheezing.   Cardiovascular: Negative for chest pain and palpitations.  Gastrointestinal: Negative for abdominal distention, abdominal pain and constipation.  Endocrine: Negative for polydipsia.  Genitourinary: Negative for dysuria and frequency.  Musculoskeletal: Negative for arthralgias and back pain.  Skin: Negative for rash.  Neurological: Negative  for tremors, light-headedness and numbness.  Hematological: Does not bruise/bleed easily.  Psychiatric/Behavioral: Negative for agitation and behavioral problems.    Objective:  BP (!) 180/87   Pulse 64   Ht 5\' 2"  (1.575 m)   Wt 165 lb 12.8 oz (75.2 kg)   SpO2 98%   BMI 30.33 kg/m   BP/Weight 05/01/2020 03/08/2019 03/01/2019  Systolic BP 180 146 116  Diastolic BP 87 86 73  Wt. (Lbs) 165.8 161 166.2  BMI 30.33 30.93 30.4      Physical Exam Constitutional:      General: She is not in acute distress.    Appearance: She is well-developed. She is not diaphoretic.  HENT:     Head: Normocephalic.     Right Ear: External ear normal.     Left Ear: External ear normal.     Nose: Nose normal.  Eyes:     Conjunctiva/sclera: Conjunctivae normal.     Pupils: Pupils are equal, round, and reactive to light.  Neck:     Vascular: No JVD.  Cardiovascular:     Rate and Rhythm: Normal rate and regular rhythm.     Heart sounds: Normal heart sounds. No murmur heard.  No gallop.   Pulmonary:     Effort: Pulmonary effort is normal. No respiratory distress.     Breath sounds: Normal breath sounds. No wheezing or rales.  Chest:     Chest wall: No tenderness.     Breasts:        Right: No mass  or tenderness.        Left: No mass or tenderness.  Abdominal:     General: Bowel sounds are normal. There is no distension.     Palpations: Abdomen is soft. There is no mass.     Tenderness: There is no abdominal tenderness.  Genitourinary:    Comments: External genitalia, vagina, cervix, adnexa-normal Musculoskeletal:        General: No tenderness. Normal range of motion.     Cervical back: Normal range of motion.  Skin:    General: Skin is warm and dry.  Neurological:     Mental Status: She is alert and oriented to person, place, and time.     Deep Tendon Reflexes: Reflexes are normal and symmetric.     CMP Latest Ref Rng & Units 03/01/2019 06/18/2017 12/22/2016  Glucose 65 - 99 mg/dL 762(G)  315(V) 96  BUN 8 - 27 mg/dL 27 17 16   Creatinine 0.57 - 1.00 mg/dL ) 7.61(Y 0.73)  Sodium 134 - 144 mmol/L 139 140 142  Potassium 3.5 - 5.2 mmol/L 3.6 4.0 4.1  Chloride 96 - 106 mmol/L 101 104 101  CO2 20 - 29 mmol/L 24 24 26   Calcium 8.7 - 10.3 mg/dL 9.1 9.0 9.1  Total Protein 6.0 - 8.5 g/dL - 7.3 7.4  Total Bilirubin 0.0 - 1.2 mg/dL - 0.8 0.8  Alkaline Phos 39 - 117 IU/L - 113 117  AST 0 - 40 IU/L - 31 22  ALT 0 - 32 IU/L - 33(H) 23    Lipid Panel     Component Value Date/Time   CHOL 194 03/08/2019 0000   TRIG 256 03/08/2019 0000   HDL 40 03/08/2019 0000   CHOLHDL 5.2 (H) 06/18/2017 0947   LDLCALC 103 03/08/2019 0000    CBC    Component Value Date/Time   WBC 5.3 06/18/2017 0947   RBC 4.79 06/18/2017 0947   HGB 13.2 06/18/2017 0947   HCT 38.9 06/18/2017 0947   PLT 272 06/18/2017 0947   MCV 81 06/18/2017 0947   MCH 27.6 06/18/2017 0947   MCHC 33.9 06/18/2017 0947   RDW 14.4 06/18/2017 0947   LYMPHSABS 1.5 06/18/2017 0947   EOSABS 0.2 06/18/2017 0947   BASOSABS 0.0 06/18/2017 0947    Lab Results  Component Value Date   HGBA1C 5.9 (A) 02/06/2019    Assessment & Plan:  1. Annual physical exam Counseled on 150 minutes of exercise per week, healthy eating (including decreased daily intake of saturated fats, cholesterol, added sugars, sodium), routine healthcare maintenance. - Basic Metabolic Panel - CBC with Differential/Platelet - T4, free - TSH  2. Screening for cervical cancer - Cytology - PAP([place])  3. Encounter for screening mammogram for malignant neoplasm of breast - MM 3D SCREEN BREAST BILATERAL; Future  4. Essential hypertension Advised to pick up antihypertensive from pharmacy  5. Screening for viral disease - HIV antibody (with reflex) - HCV RNA quant rflx ultra or genotyp([facility])  No orders of the defined types were placed in this encounter.   Follow-up: Return in about 2 months (around 07/01/2020) for PCP for  chronic disease management.       02/08/2019, MD, FAAFP. [facility], [facility] [place]   05/01/2020, 3:28 PM

## 2020-05-01 NOTE — Patient Instructions (Signed)
Mantenimiento de la salud en las mujeres Health Maintenance, Female Adoptar un estilo de vida saludable y recibir atencin preventiva son importantes para promover la salud y el bienestar. Consulte al mdico sobre:  El esquema adecuado para hacerse pruebas y exmenes peridicos.  Cosas que puede hacer por su cuenta para prevenir enfermedades y mantenerse sana. Qu debo saber sobre la dieta, el peso y el ejercicio? Consuma una dieta saludable   Consuma una dieta que incluya muchas verduras, frutas, productos lcteos con bajo contenido de grasa y protenas magras.  No consuma muchos alimentos ricos en grasas slidas, azcares agregados o sodio. Mantenga un peso saludable El ndice de masa muscular (IMC) se utiliza para identificar problemas de peso. Proporciona una estimacin de la grasa corporal basndose en el peso y la altura. Su mdico puede ayudarle a determinar su IMC y a lograr o mantener un peso saludable. Haga ejercicio con regularidad Haga ejercicio con regularidad. Esta es una de las prcticas ms importantes que puede hacer por su salud. La mayora de los adultos deben seguir estas pautas:  Realizar, al menos, 150minutos de actividad fsica por semana. El ejercicio debe aumentar la frecuencia cardaca y hacerlo transpirar (ejercicio de intensidad moderada).  Hacer ejercicios de fortalecimiento por lo menos dos veces por semana. Agregue esto a su plan de ejercicio de intensidad moderada.  Pasar menos tiempo sentados. Incluso la actividad fsica ligera puede ser beneficiosa. Controle sus niveles de colesterol y lpidos en la sangre Comience a realizarse anlisis de lpidos y colesterol en la sangre a los 20aos y luego reptalos cada 5aos. Hgase controlar los niveles de colesterol con mayor frecuencia si:  Sus niveles de lpidos y colesterol son altos.  Es mayor de 40aos.  Presenta un alto riesgo de padecer enfermedades cardacas. Qu debo saber sobre las pruebas de  deteccin del cncer? Segn su historia clnica y sus antecedentes familiares, es posible que deba realizarse pruebas de deteccin del cncer en diferentes edades. Esto puede incluir pruebas de deteccin de lo siguiente:  Cncer de mama.  Cncer de cuello uterino.  Cncer colorrectal.  Cncer de piel.  Cncer de pulmn. Qu debo saber sobre la enfermedad cardaca, la diabetes y la hipertensin arterial? Presin arterial y enfermedad cardaca  La hipertensin arterial causa enfermedades cardacas y aumenta el riesgo de accidente cerebrovascular. Es ms probable que esto se manifieste en las personas que tienen lecturas de presin arterial alta, tienen ascendencia africana o tienen sobrepeso.  Hgase controlar la presin arterial: ? Cada 3 a 5 aos si tiene entre 18 y 39 aos. ? Todos los aos si es mayor de 40aos. Diabetes Realcese exmenes de deteccin de la diabetes con regularidad. Este anlisis revisa el nivel de azcar en la sangre en ayunas. Hgase las pruebas de deteccin:  Cada tresaos despus de los 40aos de edad si tiene un peso normal y un bajo riesgo de padecer diabetes.  Con ms frecuencia y a partir de una edad inferior si tiene sobrepeso o un alto riesgo de padecer diabetes. Qu debo saber sobre la prevencin de infecciones? Hepatitis B Si tiene un riesgo ms alto de contraer hepatitis B, debe someterse a un examen de deteccin de este virus. Hable con el mdico para averiguar si tiene riesgo de contraer la infeccin por hepatitis B. Hepatitis C Se recomienda el anlisis a:  Todos los que nacieron entre 1945 y 1965.  Todas las personas que tengan un riesgo de haber contrado hepatitis C. Enfermedades de transmisin sexual (ETS)  Hgase las   pruebas de deteccin de ITS, incluidas la gonorrea y la clamidia, si: ? Es sexualmente activa y es menor de 24aos. ? Es mayor de 24aos, y el mdico le informa que corre riesgo de tener este tipo de  infecciones. ? La actividad sexual ha cambiado desde que le hicieron la ltima prueba de deteccin y tiene un riesgo mayor de tener clamidia o gonorrea. Pregntele al mdico si usted tiene riesgo.  Pregntele al mdico si usted tiene un alto riesgo de contraer VIH. El mdico tambin puede recomendarle un medicamento recetado para ayudar a evitar la infeccin por el VIH. Si elige tomar medicamentos para prevenir el VIH, primero debe hacerse los anlisis de deteccin del VIH. Luego debe hacerse anlisis cada 3meses mientras est tomando los medicamentos. Embarazo  Si est por dejar de menstruar (fase premenopusica) y usted puede quedar embarazada, busque asesoramiento antes de quedar embarazada.  Tome de 400 a 800microgramos (mcg) de cido flico todos los das si queda embarazada.  Pida mtodos de control de la natalidad (anticonceptivos) si desea evitar un embarazo no deseado. Osteoporosis y menopausia La osteoporosis es una enfermedad en la que los huesos pierden los minerales y la fuerza por el avance de la edad. El resultado pueden ser fracturas en los huesos. Si tiene 65aos o ms, o si est en riesgo de sufrir osteoporosis y fracturas, pregunte a su mdico si debe:  Hacerse pruebas de deteccin de prdida sea.  Tomar un suplemento de calcio o de vitamina D para reducir el riesgo de fracturas.  Recibir terapia de reemplazo hormonal (TRH) para tratar los sntomas de la menopausia. Siga estas instrucciones en su casa: Estilo de vida  No consuma ningn producto que contenga nicotina o tabaco, como cigarrillos, cigarrillos electrnicos y tabaco de mascar. Si necesita ayuda para dejar de fumar, consulte al mdico.  No consuma drogas.  No comparta agujas.  Solicite ayuda a su mdico si necesita apoyo o informacin para abandonar las drogas. Consumo de alcohol  No beba alcohol si: ? Su mdico le indica no hacerlo. ? Est embarazada, puede estar embarazada o est tratando de quedar  embarazada.  Si bebe alcohol: ? Limite la cantidad que consume de 0 a 1 medida por da. ? Limite la ingesta si est amamantando.  Est atento a la cantidad de alcohol que hay en las bebidas que toma. En [place], una medida equivale a una botella de cerveza de 12oz (355ml), un vaso de vino de 5oz (148ml) o un vaso de una bebida alcohlica de alta graduacin de 1oz (44ml). Instrucciones generales  Realcese los estudios de rutina de la salud, dentales y de la vista.  Mantngase al da con las vacunas.  Infrmele a su mdico si: ? Se siente deprimida con frecuencia. ? Alguna vez ha sido vctima de maltrato o no se siente segura en su casa. Resumen  Adoptar un estilo de vida saludable y recibir atencin preventiva son importantes para promover la salud y el bienestar.  Siga las instrucciones del mdico acerca de una dieta saludable, el ejercicio y la realizacin de pruebas o exmenes para detectar enfermedades.  Siga las instrucciones del mdico con respecto al control del colesterol y la presin arterial. Esta informacin no tiene como fin reemplazar el consejo del mdico. Asegrese de hacerle al mdico cualquier pregunta que tenga. Document Revised: 09/28/2018 Document Reviewed: 09/28/2018 Elsevier Patient Education  2020 Elsevier Inc.  

## 2020-05-02 LAB — CYTOLOGY - PAP
Comment: NEGATIVE
Diagnosis: NEGATIVE
High risk HPV: NEGATIVE

## 2020-05-03 ENCOUNTER — Telehealth: Payer: Self-pay

## 2020-05-03 LAB — CBC WITH DIFFERENTIAL/PLATELET
Basophils Absolute: 0 10*3/uL (ref 0.0–0.2)
Basos: 0 %
EOS (ABSOLUTE): 0.1 10*3/uL (ref 0.0–0.4)
Eos: 2 %
Hematocrit: 39.9 % (ref 34.0–46.6)
Hemoglobin: 12.8 g/dL (ref 11.1–15.9)
Immature Grans (Abs): 0 10*3/uL (ref 0.0–0.1)
Immature Granulocytes: 0 %
Lymphocytes Absolute: 1.5 10*3/uL (ref 0.7–3.1)
Lymphs: 27 %
MCH: 26.3 pg — ABNORMAL LOW (ref 26.6–33.0)
MCHC: 32.1 g/dL (ref 31.5–35.7)
MCV: 82 fL (ref 79–97)
Monocytes Absolute: 0.4 10*3/uL (ref 0.1–0.9)
Monocytes: 8 %
Neutrophils Absolute: 3.3 10*3/uL (ref 1.4–7.0)
Neutrophils: 63 %
Platelets: 291 10*3/uL (ref 150–450)
RBC: 4.86 x10E6/uL (ref 3.77–5.28)
RDW: 14.4 % (ref 11.7–15.4)
WBC: 5.4 10*3/uL (ref 3.4–10.8)

## 2020-05-03 LAB — BASIC METABOLIC PANEL
BUN/Creatinine Ratio: 19 (ref 12–28)
BUN: 13 mg/dL (ref 8–27)
CO2: 24 mmol/L (ref 20–29)
Calcium: 8.9 mg/dL (ref 8.7–10.3)
Chloride: 101 mmol/L (ref 96–106)
Creatinine, Ser: 0.68 mg/dL (ref 0.57–1.00)
GFR calc Af Amer: 108 mL/min/{1.73_m2} (ref >59)
GFR calc non Af Amer: 93 mL/min/{1.73_m2} (ref >59)
Glucose: 96 mg/dL (ref 65–99)
Potassium: 3.6 mmol/L (ref 3.5–5.2)
Sodium: 139 mmol/L (ref 134–144)

## 2020-05-03 LAB — TSH: TSH: 3.07 u[IU]/mL (ref 0.450–4.500)

## 2020-05-03 LAB — T4, FREE: Free T4: 1.15 ng/dL (ref 0.82–1.77)

## 2020-05-03 LAB — HCV RNA QUANT RFLX ULTRA OR GENOTYP: HCV Quant Baseline: NOT DETECTED IU/mL

## 2020-05-03 LAB — HIV ANTIBODY (ROUTINE TESTING W REFLEX): HIV Screen 4th Generation wRfx: NONREACTIVE

## 2020-05-03 NOTE — Telephone Encounter (Signed)
Patient name and DOB has been verified Patient was informed of lab results. Patient had no questions.  

## 2020-05-03 NOTE — Telephone Encounter (Signed)
-----   Message from Hoy Register, MD sent at 05/03/2020  9:45 AM EDT ----- HIV, hepatitis C tests are negative.  Kidney and liver functions are normal.  Pap smear is normal.

## 2020-05-13 ENCOUNTER — Telehealth: Payer: Self-pay | Admitting: Family Medicine

## 2020-05-13 NOTE — Telephone Encounter (Signed)
Pt was schedule for a financial appt for next week

## 2020-05-13 NOTE — Telephone Encounter (Signed)
Please follow up Copied from CRM [id number]. Topic: General - Other >> May 13, 2020  1:30 PM Gwenlyn Fudge wrote: Reason for CRM: Pt called stating that she called last Tuesday regarding receiving financial help and that she was not able to complete the process and that she would to finish. Please advise.

## 2020-05-22 ENCOUNTER — Other Ambulatory Visit: Payer: Self-pay

## 2020-05-22 ENCOUNTER — Ambulatory Visit: Payer: Self-pay

## 2020-05-29 ENCOUNTER — Other Ambulatory Visit: Payer: Self-pay | Admitting: Obstetrics and Gynecology

## 2020-05-29 DIAGNOSIS — Z1231 Encounter for screening mammogram for malignant neoplasm of breast: Secondary | ICD-10-CM

## 2020-07-01 ENCOUNTER — Encounter: Payer: Self-pay | Admitting: Family Medicine

## 2020-07-01 ENCOUNTER — Other Ambulatory Visit: Payer: Self-pay

## 2020-07-01 ENCOUNTER — Ambulatory Visit: Payer: Self-pay | Attending: Family Medicine | Admitting: Family Medicine

## 2020-07-01 ENCOUNTER — Other Ambulatory Visit: Payer: Self-pay | Admitting: Family Medicine

## 2020-07-01 VITALS — BP 168/84 | HR 64 | Ht 62.0 in | Wt 164.6 lb

## 2020-07-01 DIAGNOSIS — G5602 Carpal tunnel syndrome, left upper limb: Secondary | ICD-10-CM

## 2020-07-01 DIAGNOSIS — G8929 Other chronic pain: Secondary | ICD-10-CM

## 2020-07-01 DIAGNOSIS — I10 Essential (primary) hypertension: Secondary | ICD-10-CM

## 2020-07-01 DIAGNOSIS — M545 Low back pain, unspecified: Secondary | ICD-10-CM

## 2020-07-01 DIAGNOSIS — R21 Rash and other nonspecific skin eruption: Secondary | ICD-10-CM

## 2020-07-01 MED ORDER — GABAPENTIN 300 MG PO CAPS: 300.0000 mg | ORAL_CAPSULE | Freq: Two times a day (BID) | ORAL | 1 refills | Status: DC

## 2020-07-01 MED ORDER — LISINOPRIL 20 MG PO TABS: 20.0000 mg | ORAL_TABLET | Freq: Every day | ORAL | 2 refills | Status: DC

## 2020-07-01 MED ORDER — TIZANIDINE HCL 4 MG PO TABS: 4.0000 mg | ORAL_TABLET | Freq: Three times a day (TID) | ORAL | 1 refills | Status: DC | PRN

## 2020-07-01 MED ORDER — HYDROCORTISONE 1 % EX CREA: 1.0000 "application " | TOPICAL_CREAM | Freq: Two times a day (BID) | CUTANEOUS | 0 refills | Status: DC

## 2020-07-01 MED ORDER — CHLORTHALIDONE 25 MG PO TABS: 25.0000 mg | ORAL_TABLET | Freq: Every day | ORAL | 3 refills | Status: DC

## 2020-07-01 MED FILL — LISINOPRIL 20 MG TABLET: 20 | 90 days supply | Qty: 90 | Fill #0

## 2020-07-01 MED FILL — GABAPENTIN 300 MG CAPSULE: 300 | 30 days supply | Qty: 60 | Fill #0

## 2020-07-01 MED FILL — CHLORTHALIDONE 25 MG TAB: 25 | 90 days supply | Qty: 90 | Fill #0

## 2020-07-01 MED FILL — tiZANidine HCL 4 MG TABS: 4 | 30 days supply | Qty: 90 | Fill #0

## 2020-07-01 NOTE — Progress Notes (Signed)
Having numbness in left hand and left wrist.  Having back pain.

## 2020-07-01 NOTE — Progress Notes (Signed)
Subjective:  Patient ID: Shelia [name], female    DOB: [birth year]  Age: 64 y.o. MRN: [id number]  CC: Hand Pain   HPI Shelia [name] is a 64 year old female with history of hypertension who presents today with the following complaints.  Complains of L hand numbness for the last 3 months; she is R handed. Symptoms are intermittent and occur from her wrist to her hand. Denies having to shake her hand for relief. Also complains of mid back pain which is intermittent lasting about and is absent at this time. Pain is not triggered by anything and occurs spontaneously.  Pain sometimes radiates around her back to her chest wall.  But she denies chest pains Denies reflux symptoms.  Her BP is elevated and she states she ran out of her antihypertensive. She complains of pruritus in her forearms for the last couple weeks to which she has been applying alcohol to the rash which developed after  Denies exposure to new soaps or creams and has no known allergies. Past Medical History:  Diagnosis Date  . Hypertension     Past Surgical History:  Procedure Laterality Date  . NO PAST SURGERIES      Family History  Problem Relation Age of Onset  . Diabetes Father   . Hypertension Neg Hx   . CAD Neg Hx   . Cancer Neg Hx     No Known Allergies  Outpatient Medications Prior to Visit  Medication Sig Dispense Refill  . chlorthalidone (HYGROTON) 25 MG tablet Take 1 tablet (25 mg total) by mouth daily. To lower blood pressure (Patient not taking: Reported on 07/01/2020) 90 tablet 1  . lisinopril (ZESTRIL) 20 MG tablet Take 1 tablet (20 mg total) by mouth daily. To lower blood pressure (Patient not taking: Reported on 07/01/2020) 90 tablet 1  . meloxicam (MOBIC) 7.5 MG tablet One or two pills once per day as needed for joint pain; take after eating (Patient not taking: Reported on 05/01/2020) 60 tablet 2   No facility-administered medications prior to visit.      ROS Review of Systems  Constitutional: Negative for activity change, appetite change and fatigue.  HENT: Negative for congestion, sinus pressure and sore throat.   Eyes: Negative for visual disturbance.  Respiratory: Negative for cough, chest tightness, shortness of breath and wheezing.   Cardiovascular: Negative for chest pain and palpitations.  Gastrointestinal: Negative for abdominal distention, abdominal pain and constipation.  Endocrine: Negative for polydipsia.  Genitourinary: Negative for dysuria and frequency.  Musculoskeletal: Positive for back pain. Negative for arthralgias.  Skin: Positive for rash.  Neurological: Positive for numbness. Negative for tremors and light-headedness.  Hematological: Does not bruise/bleed easily.  Psychiatric/Behavioral: Negative for agitation and behavioral problems.    Objective:  BP (!) 168/84   Pulse 64   Ht 5\' 2"  (1.575 m)   Wt 164 lb 9.6 oz (74.7 kg)   SpO2 98%   BMI 30.11 kg/m   BP/Weight 07/01/2020 05/01/2020 03/08/2019  Systolic BP 168 180 146  Diastolic BP 84 87 86  Wt. (Lbs) 164.6 165.8 161  BMI 30.11 30.33 30.93      Physical Exam Constitutional:      Appearance: She is well-developed.  Neck:     Vascular: No JVD.  Cardiovascular:     Rate and Rhythm: Normal rate.     Heart sounds: Normal heart sounds. No murmur heard.   Pulmonary:     Effort: Pulmonary effort is normal.     Breath  sounds: Normal breath sounds. No wheezing or rales.  Chest:     Chest wall: No tenderness.  Abdominal:     General: Bowel sounds are normal. There is no distension.     Palpations: Abdomen is soft. There is no mass.     Tenderness: There is no abdominal tenderness.  Musculoskeletal:        General: Tenderness (on deep palpation of right posterior thoracic  muscle) present. Normal range of motion.     Right lower leg: No edema.     Left lower leg: No edema.     Comments: Normal appearance of hands Negative Phalen's and Tinel  signs  Skin:    Comments: Pinpoint dark rash on flexor aspect of right forearm  Neurological:     Mental Status: She is alert and oriented to person, place, and time.     Comments: Normal handgrip bilaterally  Psychiatric:        Mood and Affect: Mood normal.     CMP Latest Ref Rng & Units 05/01/2020 03/01/2019 06/18/2017  Glucose 65 - 99 mg/dL 96 568(L) 275(T)  BUN 8 - 27 mg/dL 13 27 17   Creatinine 0.57 - 1.00 mg/dL 7.00) 1.74(B  Sodium 134 - 144 mmol/L 139 139 140  Potassium 3.5 - 5.2 mmol/L 3.6 3.6 4.0  Chloride 96 - 106 mmol/L 101 101 104  CO2 20 - 29 mmol/L 24 24 24   Calcium 8.7 - 10.3 mg/dL 8.9 9.1 9.0  Total Protein 6.0 - 8.5 g/dL - - 7.3  Total Bilirubin 0.0 - 1.2 mg/dL - - 0.8  Alkaline Phos 39 - 117 IU/L - - 113  AST 0 - 40 IU/L - - 31  ALT 0 - 32 IU/L - - 33(H)    Lipid Panel     Component Value Date/Time   CHOL 194 03/08/2019 0000   TRIG 256 03/08/2019 0000   HDL 40 03/08/2019 0000   CHOLHDL 5.2 (H) 06/18/2017 0947   LDLCALC 103 03/08/2019 0000    CBC    Component Value Date/Time   WBC 5.4 05/01/2020 1537   RBC 4.86 05/01/2020 1537   HGB 12.8 05/01/2020 1537   HCT 39.9 05/01/2020 1537   PLT 291 05/01/2020 1537   MCV 82 05/01/2020 1537   MCH 26.3 (L) 05/01/2020 1537   MCHC 32.1 05/01/2020 1537   RDW 14.4 05/01/2020 1537   LYMPHSABS 1.5 05/01/2020 1537   EOSABS 0.1 05/01/2020 1537   BASOSABS 0.0 05/01/2020 1537    Lab Results  Component Value Date   HGBA1C 5.9 (A) 02/06/2019    Assessment & Plan:  1. Carpal tunnel syndrome of left wrist Advised to obtain wrist brace - gabapentin (NEURONTIN) 300 MG capsule; Take 1 capsule (300 mg total) by mouth 2 (two) times daily.  Dispense: 60 capsule; Refill: 1  2. Chronic midline low back pain without sciatica Musculoskeletal back pain Advised to use heat - tiZANidine (ZANAFLEX) 4 MG tablet; Take 1 tablet (4 mg total) by mouth every 8 (eight) hours as needed for muscle spasms.  Dispense: 90 tablet;  Refill: 1  3. Essential hypertension Uncontrolled due to running out of medications which I have refilled Counseled on blood pressure goal of less than 130/80, low-sodium, DASH diet, medication compliance, 150 minutes of moderate intensity exercise per week. Discussed medication compliance, adverse effects. - lisinopril (ZESTRIL) 20 MG tablet; Take 1 tablet (20 mg total) by mouth daily. To lower blood pressure  Dispense: 30 tablet; Refill: 2 - chlorthalidone (  HYGROTON) 25 MG tablet; Take 1 tablet (25 mg total) by mouth daily. To lower blood pressure  Dispense: 30 tablet; Refill: 3  4. Rash - hydrocortisone cream 1 %; Apply 1 application topically 2 (two) times daily.  Dispense: 30 g; Refill: 0   No orders of the defined types were placed in this encounter.   Follow-up: Return for Dr Jillyn Hidden - Hypertension.       Hoy Register, MD, FAAFP. [facility] [place], [place] [phone]   07/01/2020, 4:28 PM

## 2020-07-01 NOTE — Patient Instructions (Signed)
Dolor de espalda crnico Chronic Back Pain Cuando el dolor de espalda dura ms de 3 meses se denomina dolor de espalda crnico. Es posible que se desconozca la causa de su dolor de espalda. Algunas causas frecuentes son las siguientes:  Desgaste (enfermedad degenerativa) de los huesos, los ligamentos o los discos de la espalda.  Inflamacin y rigidez en la espalda (artritis). Las personas que sufren dolor de espalda crnico generalmente atraviesan determinados perodos en los que este es ms intenso (episodios de exacerbacin del dolor). Muchas personas pueden aprender a controlar el dolor de espalda con el cuidado en el hogar. Sigue estas instrucciones en tu casa: Est atento a cualquier cambio en los sntomas. Tome estas medidas para aliviar el dolor: Actividad   Evite agacharse y realizar otras actividades que agraven el problema.  Mantenga una postura correcta mientras est de pie o sentado: ? Cuando est de pie, mantenga la parte alta de la espalda y el cuello rectos, con los hombros hacia atrs. Evite encorvarse. ? Cuando est sentado, mantenga la espalda recta y relaje los hombros. No curve los hombros ni los eche hacia atrs.  No permanezca sentado o de pie en el mismo lugar durante mucho tiempo.  Durante el da, descanse durante lapsos breves. Esto le aliviar el dolor. Descansar recostado o de pie suele ser mejor que hacerlo sentado.  Cuando descanse durante perodos ms largos, incorpore alguna actividad suave o ejercicios de elongacin entre uno y otro. Esto ayudar a evitar la rigidez y el dolor.  Hacer ejercicio con regularidad. Pregntele al mdico qu actividades son seguras para usted.  No levante ningn objeto que pese ms de 10libras (4.5kg). Siempre use las tcnicas correctas para levantar objetos, entre ellas: ? Flexionar las rodillas. ? Mantener la carga cerca del cuerpo. ? No torcerse.  Duerma sobre un colchn firme en una posicin cmoda. Intente acostarse de  costado, con las rodillas ligeramente flexionadas. Si se recuesta sobre la espalda, coloque una almohada debajo de las rodillas. Control del dolor  Si se lo indican, aplique hielo sobre la zona dolorida. El mdico puede recomendarle que se aplique hielo durante las primeras 24a 48horas despus del comienzo de un episodio de exacerbacin del dolor. ? Ponga el hielo en una bolsa plstica. ? Coloque una toalla entre la piel y la bolsa. ? Coloque el hielo durante 20minutos, 2a3veces al da.  Si se lo indican, aplique calor en la zona afectada con la frecuencia que le haya indicado el mdico. Use la fuente de calor que el mdico le recomiende, como una compresa de calor hmedo o una almohadilla trmica. ? Colquese una toalla entre la piel y la fuente de calor. ? Aplique calor durante 20 a 30minutos. ? Retire la fuente de calor si la piel se pone de color rojo brillante. Esto es especialmente importante si no puede sentir dolor, calor o fro. Puede correr un riesgo mayor de sufrir quemaduras.  Intente tomar un bao de inmersin con agua caliente.  Toma los medicamentos de venta libre y los recetados solamente como se lo haya indicado el mdico.  Concurrir a todas las visitas de seguimiento como se lo haya indicado el mdico. Esto es importante. Comuncate con un mdico si:  Siente un dolor que no se alivia con reposo o medicamentos. Solicite ayuda inmediatamente si:  Siente debilidad o adormecimiento en una o ambas piernas, o en uno o ambos pies.  Tiene dificultad para controlar la miccin o la defecacin.  Tiene nuseas o vmitos.  Tiene dolor   en el abdomen.  Le falta el aire o se desmaya. Esta informacin no tiene como fin reemplazar el consejo del mdico. Asegrese de hacerle al mdico cualquier pregunta que tenga. Document Revised: 09/09/2018 Document Reviewed: 09/09/2018 Elsevier Patient Education  2020 Elsevier Inc.  

## 2020-07-04 ENCOUNTER — Ambulatory Visit: Payer: Self-pay | Admitting: *Deleted

## 2020-07-04 ENCOUNTER — Other Ambulatory Visit: Payer: Self-pay

## 2020-07-04 ENCOUNTER — Ambulatory Visit
Admission: RE | Admit: 2020-07-04 | Discharge: 2020-07-04 | Disposition: A | Discharge status: Home / Self Care | Payer: No Typology Code available for payment source | Source: Ambulatory Visit | Attending: Obstetrics and Gynecology | Admitting: Obstetrics and Gynecology

## 2020-07-04 VITALS — BP 158/104 | Temp 97.8°F | Wt 164.8 lb

## 2020-07-04 DIAGNOSIS — Z1231 Encounter for screening mammogram for malignant neoplasm of breast: Secondary | ICD-10-CM

## 2020-07-04 DIAGNOSIS — Z1239 Encounter for other screening for malignant neoplasm of breast: Secondary | ICD-10-CM

## 2020-07-04 NOTE — Progress Notes (Signed)
[name] is a 64 y.o. female who presents to [facility] clinic today with no complaints.    Pap Smear: Pap smear not completed today. Last Pap smear was 05/01/2020 at [facility] and was normal with negative HPV. Per patient has no history of an abnormal Pap smear. Last Pap smear result is available in Epic.   Physical exam: Breasts Breasts symmetrical. No skin abnormalities bilateral breasts. No nipple retraction bilateral breasts. No nipple discharge bilateral breasts. No lymphadenopathy. No lumps palpated bilateral breasts. No complaints of pain or tenderness on exam.      Pelvic/Bimanual Pap is not indicated today pear BCCCP guidelines.   Smoking History: Patient has never smoked.   Patient Navigation: Patient education provided. Access to services provided for patient through [facility] program. Spanish interpreter Natale Lay from [facility] provided. Transportation provided home following appointment.  Colorectal Cancer Screening: Per patient had a colonoscopy completed 3 years ago. No complaints today.    Breast and Cervical Cancer Risk Assessment: Patient does not have family history of breast cancer, known genetic mutations, or radiation treatment to the chest before age 24. Patient does not have history of cervical dysplasia, immunocompromised, or DES exposure in-utero.  Risk Assessment    Risk Scores      07/04/2020 02/16/2019   Last edited by: Meryl Dare, CMA Stoney Bang H, LPN   5-year risk: 0.9 % 0.9 %   Lifetime risk: 4.2 % 4.3 %          A: BCCCP exam without pap smear No complaints.  P: Referred patient to the [facility] for a screening mammogram on the mobile unit. Appointment scheduled Thursday, July 04, 2020 at 0940.  Priscille Heidelberg, RN 07/04/2020 9:07 AM

## 2020-07-04 NOTE — Patient Instructions (Signed)
Explained breast self awareness with [name]. Patient did not need a Pap smear today due to last Pap smear and HPV typing was 05/01/2020. Let her know BCCCP will cover Pap smears and HPV typing every 5 years unless has a history of abnormal Pap smears. Referred patient to the [facility] for a screening mammogram on the mobile unit. Appointment scheduled Thursday, July 04, 2020 at 0940. Patient escorted to mobile unit following BCCCP appointment for her screening mammogram. Let patient know the Breast Center will follow up with her within the next couple weeks with results of her mammogram by letter or phone. Shelia [name] verbalized understanding.  Carys Malina, Kathaleen Maser, RN 9:08 AM

## 2020-07-09 MED FILL — tiZANidine HCL 4 MG TABS: 4 | 30 days supply | Qty: 90 | Fill #0

## 2020-07-09 MED FILL — CHLORTHALIDONE 25 MG TAB: 25 | 90 days supply | Qty: 90 | Fill #0

## 2020-07-09 MED FILL — LISINOPRIL 20 MG TABLET: 20 | 90 days supply | Qty: 90 | Fill #0

## 2020-07-09 MED FILL — GABAPENTIN 300 MG CAPSULE: 300 | 30 days supply | Qty: 60 | Fill #0

## 2020-10-10 ENCOUNTER — Other Ambulatory Visit: Payer: Self-pay

## 2020-10-10 ENCOUNTER — Ambulatory Visit: Payer: Self-pay | Attending: Internal Medicine | Admitting: Physician Assistant

## 2020-10-10 ENCOUNTER — Other Ambulatory Visit: Payer: Self-pay | Admitting: Physician Assistant

## 2020-10-10 VITALS — BP 190/78 | HR 70 | Ht 62.0 in | Wt 167.4 lb

## 2020-10-10 DIAGNOSIS — M545 Low back pain, unspecified: Secondary | ICD-10-CM

## 2020-10-10 DIAGNOSIS — G8929 Other chronic pain: Secondary | ICD-10-CM

## 2020-10-10 DIAGNOSIS — I10 Essential (primary) hypertension: Secondary | ICD-10-CM

## 2020-10-10 DIAGNOSIS — R0789 Other chest pain: Secondary | ICD-10-CM

## 2020-10-10 MED ORDER — OLOPATADINE HCL 0.1 % OP SOLN: 1.0000 [drp] | Freq: Two times a day (BID) | OPHTHALMIC | 12 refills | Status: DC

## 2020-10-10 MED ORDER — METHOCARBAMOL 500 MG PO TABS: 500.0000 mg | ORAL_TABLET | Freq: Three times a day (TID) | ORAL | 1 refills | Status: DC | PRN

## 2020-10-10 MED ORDER — AMLODIPINE BESYLATE 10 MG PO TABS: 10.0000 mg | ORAL_TABLET | Freq: Every day | ORAL | 3 refills | Status: DC

## 2020-10-10 MED ORDER — NAPROXEN 500 MG PO TABS: 500.0000 mg | ORAL_TABLET | Freq: Two times a day (BID) | ORAL | 1 refills | Status: DC

## 2020-10-10 NOTE — Progress Notes (Signed)
Having pain in both breast.

## 2020-10-10 NOTE — Progress Notes (Signed)
Shelia [name], is a 65 y.o. female  EEF:[id number]  OIT:[id number]  DOB - 1956-04-21  Subjective:  Chief Complaint and HPI: Shelia [name] is a 65 y.o. female here today with continued pain in B breasts and in her mid to upper back and ran out of meds to help with it.  Also of note, she is not taking lisinopril and says it made her vomit.  No CP/pressure/palpitations/radiating pain or jaw pain.  No SOB.   This has been ongoing for about 9 months now.  mmg in October was normal.    Emmanuel with [employer] interpreters translating  ROS:   Constitutional:  No f/c, No night sweats, No unexplained weight loss. EENT:  No vision changes, No blurry vision, No hearing changes. No mouth, throat, or ear problems.  Respiratory: No cough, No SOB Cardiac: No CP, no palpitations GI:  No abd pain, No N/V/D. GU: No Urinary s/sx Musculoskeletal: see above Neuro: No headache, no dizziness, no motor weakness.  Skin: No rash Endocrine:  No polydipsia. No polyuria.  Psych: Denies SI/HI  No problems updated.  ALLERGIES: No Known Allergies  PAST MEDICAL HISTORY: Past Medical History:  Diagnosis Date  . Hypertension     MEDICATIONS AT HOME: Prior to Admission medications   Medication Sig Start Date End Date Taking? Authorizing Provider  amLODipine (NORVASC) 10 MG tablet Take 1 tablet (10 mg total) by mouth daily. 10/10/20  Yes Georgian Co M, PA-C  chlorthalidone (HYGROTON) 25 MG tablet Take 1 tablet (25 mg total) by mouth daily. To lower blood pressure 07/01/20  Yes Newlin, Enobong, MD  gabapentin (NEURONTIN) 300 MG capsule Take 1 capsule (300 mg total) by mouth 2 (two) times daily. 07/01/20  Yes Hoy Register, MD  hydrocortisone cream 1 % Apply 1 application topically 2 (two) times daily. 07/01/20  Yes Hoy Register, MD  methocarbamol (ROBAXIN) 500 MG tablet Take 1 tablet (500 mg total) by mouth every 8 (eight) hours as needed for muscle spasms. 10/10/20  Yes Georgian Co M, PA-C  naproxen (NAPROSYN) 500 MG tablet Take 1 tablet (500 mg total) by mouth 2 (two) times daily with a meal. Prn pain 10/10/20  Yes Milli Woolridge M, PA-C  olopatadine (PATANOL) 0.1 % ophthalmic solution Place 1 drop into both eyes 2 (two) times daily. 10/10/20  Yes Anders Simmonds, PA-C     Objective:  EXAM:   Vitals:   10/10/20 1621  BP: (!) 190/78  Pulse: 70  SpO2: 97%  Weight: 167 lb 6.4 oz (75.9 kg)  Height: 5\' 2"  (1.575 m)    General appearance : A&OX3. NAD. Non-toxic-appearing HEENT: Atraumatic and Normocephalic.  PERRLA. EOM intact.  Neck: supple, no JVD. No cervical lymphadenopathy. No thyromegaly Chest/Lungs:  Breathing-non-labored, Good air entry bilaterally, breath sounds normal without rales, rhonchi, or wheezing  CVS: S1 S2 regular, no murmurs, gallops, rubs  B breasts examined w/o lesion, no skin changes, no lump/mass, no axillary LN Trapezius and rhomboid spasms B in mid upper back Extremities: Bilateral Lower Ext shows no edema, both legs are warm to touch with = pulse throughout Neurology:  CN II-XII grossly intact, Non focal.   Psych:  TP linear. J/I WNL. Normal speech. Appropriate eye contact and affect.  Skin:  No Rash  Data Review Lab Results  Component Value Date   HGBA1C 5.9 (A) 02/06/2019   HGBA1C 6.2 (H) 07/02/2017     Assessment & Plan   1. Chest wall pain musculoskeletal - naproxen (NAPROSYN) 500 MG tablet;  Take 1 tablet (500 mg total) by mouth 2 (two) times daily with a meal. Prn pain  Dispense: 60 tablet; Refill: 1 - methocarbamol (ROBAXIN) 500 MG tablet; Take 1 tablet (500 mg total) by mouth every 8 (eight) hours as needed for muscle spasms.  Dispense: 90 tablet; Refill: 1  2. Chronic midline low back pain without sciatica - naproxen (NAPROSYN) 500 MG tablet; Take 1 tablet (500 mg total) by mouth 2 (two) times daily with a meal. Prn pain  Dispense: 60 tablet; Refill: 1 - methocarbamol (ROBAXIN) 500 MG tablet; Take 1 tablet (500  mg total) by mouth every 8 (eight) hours as needed for muscle spasms.  Dispense: 90 tablet; Refill: 1  3. Essential hypertension Uncontrolled.  Continue chlorthalidone - amLODipine (NORVASC) 10 MG tablet; Take 1 tablet (10 mg total) by mouth daily.  Dispense: 90 tablet; Refill: 3   [employer] interpreters used and additional time performing visit was required.   Patient have been counseled extensively about nutrition and exercise  Return in about 1 month (around 11/10/2020) for 1 month with Franky Macho for BP;  3 months with PCP for chronic conditions.  The patient was given clear instructions to go to ER or return to medical center if symptoms don't improve, worsen or new problems develop. The patient verbalized understanding. The patient was told to call to get lab results if they haven't heard anything in the next week.     Georgian Co, PA-C [facility] [place], [place] [phone]   10/10/2020, 5:01 PMPatient ID: Shelia [name], female   DOB: 11-Aug-1956, 65 y.o.   MRN: [id number]

## 2020-10-11 MED FILL — OLOPATADINE HCL 0.1 % SOLN: 0.1 | 18 days supply | Qty: 5 | Fill #0

## 2020-10-11 MED FILL — NAPROXEN 500 MG TABLET: 500 | 30 days supply | Qty: 60 | Fill #0

## 2020-10-11 MED FILL — AMLODIPINE BESYLATE 10 MG T: 10 | 30 days supply | Qty: 30 | Fill #0

## 2020-10-11 MED FILL — METHOCARBAMOL 500 MG TABS: 500 | 30 days supply | Qty: 90 | Fill #0

## 2020-10-22 MED FILL — NAPROXEN 500 MG TABLET: 500 | 30 days supply | Qty: 60 | Fill #0

## 2020-10-22 MED FILL — METHOCARBAMOL 500 MG TABS: 500 | 30 days supply | Qty: 90 | Fill #0

## 2020-10-22 MED FILL — OLOPATADINE HCL 0.1 % SOLN: 0.1 | 18 days supply | Qty: 5 | Fill #0

## 2020-10-22 MED FILL — AMLODIPINE BESYLATE 10 MG T: 10 | 30 days supply | Qty: 30 | Fill #0

## 2020-11-12 ENCOUNTER — Ambulatory Visit: Payer: No Typology Code available for payment source | Admitting: Pharmacist

## 2020-11-14 ENCOUNTER — Ambulatory Visit: Payer: No Typology Code available for payment source | Admitting: Pharmacist

## 2020-11-28 ENCOUNTER — Other Ambulatory Visit: Payer: Self-pay

## 2020-11-28 ENCOUNTER — Ambulatory Visit: Payer: Self-pay | Attending: Family Medicine

## 2020-12-09 ENCOUNTER — Ambulatory Visit: Payer: No Typology Code available for payment source

## 2020-12-12 ENCOUNTER — Ambulatory Visit: Payer: No Typology Code available for payment source

## 2020-12-12 ENCOUNTER — Encounter: Payer: Self-pay | Admitting: Pharmacist

## 2020-12-12 ENCOUNTER — Ambulatory Visit: Payer: Self-pay | Attending: Family Medicine | Admitting: Pharmacist

## 2020-12-12 ENCOUNTER — Other Ambulatory Visit: Payer: Self-pay

## 2020-12-12 VITALS — BP 175/92 | HR 74

## 2020-12-12 DIAGNOSIS — I10 Essential (primary) hypertension: Secondary | ICD-10-CM

## 2020-12-12 NOTE — Progress Notes (Signed)
   S:    PCP: Dr. Alvis Lemmings   Patient arrives in good spirits. Presents to the clinic for hypertension evaluation, counseling, and management. Patient was referred and last seen by Marylene Land on 10/10/2020.  Medication adherence reported initially. After getting her BP today, I questioned her further. She admits to omitting doses due to nausea when taking medications. Pt reports taking all of her medications first thing on an empty stomach.   Current BP Medications include:  Amlodipine 10 mg daily, chlorthalidone 25 mg daily   Dietary habits include: compliant with salt restriction; denies caffeine intake  Exercise habits include: "very little"  Family / Social history:  - FHx: DM - Tobacco: never smoker  - Alcohol: denies use   O:  Vitals:   12/12/20 1609  BP: (!) 175/92  Pulse: 74    Home BP readings: none  Last 3 Office BP readings: BP Readings from Last 3 Encounters:  12/12/20 (!) 175/92  10/10/20 (!) 190/78  07/04/20 (!) 158/104    BMET    Component Value Date/Time   NA 139 05/01/2020 1537   K 3.6 05/01/2020 1537   CL 101 05/01/2020 1537   CO2 24 05/01/2020 1537   GLUCOSE 96 05/01/2020 1537   BUN 13 05/01/2020 1537   CREATININE 0.68 05/01/2020 1537   CALCIUM 8.9 05/01/2020 1537   GFRNONAA 93 05/01/2020 1537   GFRAA 108 05/01/2020 1537    Renal function: CrCl cannot be calculated (Patient's most recent lab result is older than the maximum 21 days allowed.).  Clinical ASCVD: No  The 10-year ASCVD risk score Denman George DC Jr., et al., 2013) is: 13.9%   Values used to calculate the score:     Age: 65 years     Sex: Female     Is Non-Hispanic African American: No     Diabetic: No     Tobacco smoker: No     Systolic Blood Pressure: 175 mmHg     Is BP treated: Yes     HDL Cholesterol: 40 mg/dL     Total Cholesterol: 194 mg/dL   A/P: Hypertension longstanding currently uncontrolled on current medications. BP Goal = < 130/80 mmHg. She's not taking her medications  consistently d/t GI side effects. I encouraged her to take her medications following a meal. No changes at this time. Return in 2-3 weeks for a BP recheck.  -Continued current regimen.  -Counseled on lifestyle modifications for blood pressure control including reduced dietary sodium, increased exercise, adequate sleep.  Results reviewed and written information provided.   Total time in face-to-face counseling 30 minutes.   F/U Clinic Visit in 2-3 weeks.    Butch Penny, PharmD, Patsy Baltimore, CPP Clinical Pharmacist [facility] [phone]

## 2021-01-06 ENCOUNTER — Ambulatory Visit: Payer: No Typology Code available for payment source | Admitting: Family Medicine

## 2021-01-17 ENCOUNTER — Ambulatory Visit: Payer: No Typology Code available for payment source

## 2021-02-12 ENCOUNTER — Ambulatory Visit: Payer: No Typology Code available for payment source | Admitting: Family Medicine

## 2021-05-01 ENCOUNTER — Emergency Department (HOSPITAL_COMMUNITY): Payer: No Typology Code available for payment source

## 2021-05-01 ENCOUNTER — Encounter (HOSPITAL_COMMUNITY): Payer: Self-pay

## 2021-05-01 ENCOUNTER — Other Ambulatory Visit: Payer: Self-pay

## 2021-05-01 ENCOUNTER — Emergency Department (HOSPITAL_COMMUNITY)
Admission: EM | Admit: 2021-05-01 | Discharge: 2021-05-01 | Disposition: A | Discharge status: Home / Self Care | Payer: No Typology Code available for payment source | Attending: Emergency Medicine | Admitting: Emergency Medicine

## 2021-05-01 DIAGNOSIS — Z5321 Procedure and treatment not carried out due to patient leaving prior to being seen by health care provider: Secondary | ICD-10-CM | POA: Insufficient documentation

## 2021-05-01 DIAGNOSIS — M25511 Pain in right shoulder: Secondary | ICD-10-CM | POA: Insufficient documentation

## 2021-05-01 DIAGNOSIS — I10 Essential (primary) hypertension: Secondary | ICD-10-CM | POA: Insufficient documentation

## 2021-05-01 LAB — CBC WITH DIFFERENTIAL/PLATELET
Abs Immature Granulocytes: 0 10*3/uL (ref 0.00–0.07)
Basophils Absolute: 0 10*3/uL (ref 0.0–0.1)
Basophils Relative: 1 %
Eosinophils Absolute: 0.2 10*3/uL (ref 0.0–0.5)
Eosinophils Relative: 3 %
HCT: 39.2 % (ref 36.0–46.0)
Hemoglobin: 13 g/dL (ref 12.0–15.0)
Immature Granulocytes: 0 %
Lymphocytes Relative: 25 %
Lymphs Abs: 1.5 10*3/uL (ref 0.7–4.0)
MCH: 27.8 pg (ref 26.0–34.0)
MCHC: 33.2 g/dL (ref 30.0–36.0)
MCV: 83.8 fL (ref 80.0–100.0)
Monocytes Absolute: 0.5 10*3/uL (ref 0.1–1.0)
Monocytes Relative: 8 %
Neutro Abs: 3.8 10*3/uL (ref 1.7–7.7)
Neutrophils Relative %: 63 %
Platelets: 285 10*3/uL (ref 150–400)
RBC: 4.68 MIL/uL (ref 3.87–5.11)
RDW: 14.3 % (ref 11.5–15.5)
WBC: 5.9 10*3/uL (ref 4.0–10.5)
nRBC: 0 % (ref 0.0–0.2)

## 2021-05-01 LAB — BASIC METABOLIC PANEL
Anion gap: 8 (ref 5–15)
BUN: 13 mg/dL (ref 8–23)
CO2: 27 mmol/L (ref 22–32)
Calcium: 9 mg/dL (ref 8.9–10.3)
Chloride: 103 mmol/L (ref 98–111)
Creatinine, Ser: 0.66 mg/dL (ref 0.44–1.00)
GFR, Estimated: >60 mL/min (ref >60)
Glucose, Bld: 105 mg/dL — ABNORMAL HIGH (ref 70–99)
Potassium: 3.6 mmol/L (ref 3.5–5.1)
Sodium: 138 mmol/L (ref 135–145)

## 2021-05-01 NOTE — ED Notes (Signed)
Patient was not wanting to stay and wait any longer, pt was advised to stay. Pt still decided to leave.

## 2021-05-01 NOTE — ED Provider Notes (Signed)
Emergency Medicine Provider Triage Evaluation Note  Shelia [name] , a 65 y.o. female  was evaluated in triage.  Pt complains of right shoulder pain.  Pain started on Tuesday.  No recent falls or injuries.  Pain has been constant since then.  Patient reports increased pain with touch or movement.  Patient reports history of high blood pressure.  States that she has not been taking prescribed medications.  Patient denies any visual disturbance, headache, chest pain, shortness of breath.  Review of Systems  Positive: Arthralgia Negative: Visual disturbance, chest pain, headache, shortness of breath  Physical Exam  BP (!) 225/97 (BP Location: Left Arm)   Pulse 71   Temp 98.9 F (37.2 C) (Oral)   Resp 14   Ht 5' 1.42" (1.56 m)   Wt 78 kg   SpO2 99%   BMI 32.05 kg/m  Gen:   Awake, no distress   Resp:  Normal effort  MSK:   Decreased range of motion to right shoulder secondary to complaints of pain.  Patient has diffuse tenderness to right shoulder.  No swelling or warmth noted.  No deformity to right shoulder. Other:    Medical Decision Making  Medically screening exam initiated at 5:13 PM.  Appropriate orders placed.  Shelia [name] was informed that the remainder of the evaluation will be completed by another provider, this initial triage assessment does not replace that evaluation, and the importance of remaining in the ED until their evaluation is complete.  The patient appears stable so that the remainder of the work up may be completed by another provider.      Haskel Schroeder, PA-C 05/01/21 1715    Charlynne Pander, MD 05/01/21 [phone]

## 2021-05-01 NOTE — ED Triage Notes (Signed)
Pt states that she started having R shoulder pain on Tuesday without known injury. Pt hypertensive in triage. States that she has hx of htn not compliant with medications.

## 2021-05-08 ENCOUNTER — Other Ambulatory Visit: Payer: Self-pay

## 2021-05-08 ENCOUNTER — Ambulatory Visit: Payer: Self-pay | Attending: Physician Assistant | Admitting: Physician Assistant

## 2021-05-08 ENCOUNTER — Encounter: Payer: Self-pay | Admitting: Physician Assistant

## 2021-05-08 VITALS — BP 219/101 | HR 73 | Ht 62.0 in | Wt 168.4 lb

## 2021-05-08 DIAGNOSIS — M62838 Other muscle spasm: Secondary | ICD-10-CM

## 2021-05-08 DIAGNOSIS — R0789 Other chest pain: Secondary | ICD-10-CM | POA: Insufficient documentation

## 2021-05-08 DIAGNOSIS — I10 Essential (primary) hypertension: Secondary | ICD-10-CM

## 2021-05-08 DIAGNOSIS — I160 Hypertensive urgency: Secondary | ICD-10-CM

## 2021-05-08 DIAGNOSIS — M25511 Pain in right shoulder: Secondary | ICD-10-CM

## 2021-05-08 DIAGNOSIS — M545 Low back pain, unspecified: Secondary | ICD-10-CM | POA: Insufficient documentation

## 2021-05-08 DIAGNOSIS — Z09 Encounter for follow-up examination after completed treatment for conditions other than malignant neoplasm: Secondary | ICD-10-CM

## 2021-05-08 MED ORDER — CYCLOBENZAPRINE HCL 5 MG PO TABS
5.0000 mg | ORAL_TABLET | Freq: Three times a day (TID) | ORAL | 1 refills | Status: DC | PRN
  Filled 2021-05-08: qty 30, 10d supply, fill #0

## 2021-05-08 MED ORDER — AMLODIPINE BESYLATE 10 MG PO TABS
ORAL_TABLET | Freq: Every day | ORAL | 3 refills | Status: AC
  Filled 2021-05-08: qty 30, 30d supply, fill #0

## 2021-05-08 MED ORDER — CLONIDINE HCL 0.2 MG PO TABS
0.2000 mg | ORAL_TABLET | Freq: Once | ORAL | Status: AC
  Administered 2021-05-08: 0.2 mg via ORAL

## 2021-05-08 MED ORDER — PREDNISONE 10 MG PO TABS
ORAL_TABLET | ORAL | 0 refills | Status: DC
  Filled 2021-05-08: qty 21, 6d supply, fill #0

## 2021-05-08 MED ORDER — CHLORTHALIDONE 25 MG PO TABS
ORAL_TABLET | ORAL | 3 refills | Status: AC
  Filled 2021-05-08: qty 30, 30d supply, fill #0

## 2021-05-08 NOTE — Progress Notes (Signed)
Patient ID: Shelia [name], female   DOB: [birth year], 65 y.o.   MRN: [id number]     Shelia [name], is a 65 y.o. female  HRC:[id number]  WOE:[id number]  DOB - Jun 28, 1956  Chief Complaint  Patient presents with   Hypertension       Subjective:   Shelia [name] is a 65 y.o. female here today for a follow up after being seen in the ED 8/11 for R shoulder pain.  She has not been taking her BP meds.  She c/o R arm pain since about 1 week ago.  She was riding the bus and it was raining.  She had heavy bags with her and it started hurting after that in the R side of her neck. No weakness.  No paresthesias.  No CP.  No SOB.  No HA.  No loss of grip.  Not taking anything for the discomfort.  Cbc abd BMP stable at ED   Problem  Chronic Midline Low Back Pain Without Sciatica  Chest Wall Pain    ALLERGIES: Allergies  Allergen Reactions   Lisinopril Nausea And Vomiting    PAST MEDICAL HISTORY: Past Medical History:  Diagnosis Date   Hypertension     MEDICATIONS AT HOME: Prior to Admission medications   Medication Sig Start Date End Date Taking? Authorizing Provider  cyclobenzaprine (FLEXERIL) 5 MG tablet Take 1 tablet (5 mg total) by mouth 3 (three) times daily as needed for muscle spasms. 05/08/21  Yes Winthrop Shannahan, Marzella Schlein, PA-C  gabapentin (NEURONTIN) 300 MG capsule TAKE 1 CAPSULE (300 MG TOTAL) BY MOUTH 2 (TWO) TIMES DAILY. 07/01/20 07/01/21 Yes Hoy Register, MD  hydrocortisone cream 1 % Apply 1 application topically 2 (two) times daily. 07/01/20  Yes Newlin, Odette Horns, MD  naproxen (NAPROSYN) 500 MG tablet TAKE 1 TABLET (500 MG TOTAL) BY MOUTH 2 (TWO) TIMES DAILY WITH A MEAL AS NEEDED FOR PAIN 10/10/20 10/10/21 Yes Louna Rothgeb M, PA-C  olopatadine (PATANOL) 0.1 % ophthalmic solution PLACE 1 DROP INTO BOTH EYES 2 (TWO) TIMES DAILY. 10/10/20 10/10/21 Yes Martina Brodbeck, Marzella Schlein, PA-C  predniSONE (DELTASONE) 10 MG tablet 6,5,4,3,2,1 take each days dose in am with  food 05/08/21  Yes Lenzie Montesano M, PA-C  amLODipine (NORVASC) 10 MG tablet TAKE 1 TABLET (10 MG TOTAL) BY MOUTH DAILY. 05/08/21 05/08/22  Anders Simmonds, PA-C  chlorthalidone (HYGROTON) 25 MG tablet TAKE 1 TABLET (25 MG TOTAL) BY MOUTH DAILY. TO LOWER BLOOD PRESSURE 05/08/21 05/08/22  Anders Simmonds, PA-C  lisinopril (ZESTRIL) 20 MG tablet Take 1 tablet (20 mg total) by mouth daily. To lower blood pressure 07/01/20 10/10/20  Newlin, Odette Horns, MD    ROS: Neg HEENT Neg resp Neg cardiac Neg GI Neg GU Neg psych Neg neuro  Objective:   Vitals:   05/08/21 1622  BP: (!) 219/101  Pulse: 73  SpO2: 98%  Weight: 168 lb 6.4 oz (76.4 kg)  Height: 5\' 2"  (1.575 m)   Exam General appearance : Awake, alert, not in any distress. Speech Clear. Not toxic looking HEENT: Atraumatic and Normocephalic Neck: Supple, no JVD. No cervical lymphadenopathy.  Chest: Good air entry bilaterally, CTAB.  No rales/rhonchi/wheezing CVS: S1 S2 regular, no murmurs.  Neck-no c-spine TTP.  There is spasm in the R trapezius that is TTP down into the R shoulder.  RUE with full S&ROM.  No point tenderness,  joint is stable.  No erythema, swelling, or ballotment Extremities: B/L Lower Ext shows no edema, both legs are warm to touch Neurology: Awake  alert, and oriented X 3, CN II-XII intact, Non focal Skin: No Rash  Data Review Lab Results  Component Value Date   HGBA1C 5.9 (A) 02/06/2019   HGBA1C 6.2 (H) 07/02/2017    Assessment & Plan   1. Hypertensive urgency Did not come down significantly in office.  She is asymptomatic.  Check BP when you get home. If still elevated or you have dizziness, headache or blurred vision or Chest pain, go to the emergency department or call 911.  Resume your other blood pressure medications - cloNIDine (CATAPRES) tablet 0.2 mg  2. Essential hypertension Uncontrolled-non-compliant - chlorthalidone (HYGROTON) 25 MG tablet; TAKE 1 TABLET (25 MG TOTAL) BY MOUTH DAILY. TO LOWER  BLOOD PRESSURE  Dispense: 30 tablet; Refill: 3 - amLODipine (NORVASC) 10 MG tablet; TAKE 1 TABLET (10 MG TOTAL) BY MOUTH DAILY.  Dispense: 90 tablet; Refill: 3  3. Cervical paraspinal muscle spasm - cyclobenzaprine (FLEXERIL) 5 MG tablet; Take 1 tablet (5 mg total) by mouth 3 (three) times daily as needed for muscle spasms.  Dispense: 30 tablet; Refill: 1 - predniSONE (DELTASONE) 10 MG tablet; 6,5,4,3,2,1 take each days dose in am with food  Dispense: 21 tablet; Refill: 0  4. Acute pain of right shoulder - cyclobenzaprine (FLEXERIL) 5 MG tablet; Take 1 tablet (5 mg total) by mouth 3 (three) times daily as needed for muscle spasms.  Dispense: 30 tablet; Refill: 1 - predniSONE (DELTASONE) 10 MG tablet; 6,5,4,3,2,1 take each days dose in am with food  Dispense: 21 tablet; Refill: 0  5. Hospital discharge follow-up    Patient have been counseled extensively about nutrition and exercise. Other issues discussed during this visit include: low cholesterol diet, weight control and daily exercise, foot care, annual eye examinations at Ophthalmology, importance of adherence with medications and regular follow-up. We also discussed long term complications of uncontrolled diabetes and hypertension.   Return for 3 weeks with Franky Macho for BP and 3 months with PCP.  The patient was given clear instructions to go to ER or return to medical center if symptoms don't improve, worsen or new problems develop. The patient verbalized understanding. The patient was told to call to get lab results if they haven't heard anything in the next week.      Georgian Co, PA-C [facility] and [facility] [place], [place] [phone]   05/08/2021, 4:40 PM

## 2021-05-08 NOTE — Patient Instructions (Addendum)
Check BP when you get home. If still elevated or you have dizziness, headache or blurred vision or Chest pain, go to the emergency department or call 911.  Resume your other blood pressure medications  Controle la presin arterial cuando llegue a casa. Si todava est elevado o tiene mareos, dolor de [place] o visin borrosa o dolor en el pecho, vaya al departamento de emergencias o llame al 911. Reanude sus otros medicamentos para la presin arterial.

## 2021-05-14 ENCOUNTER — Telehealth: Payer: Self-pay | Admitting: Family Medicine

## 2021-05-14 NOTE — Telephone Encounter (Signed)
Pt seen Shelia [name] on 05-08-2021 for right shoulder pain and was given medication that is not working and would like something else. Pt is at work and does not remember the name of medication. Spanish interpreter carlos ID [phone] was on the call and he d/c from the call. [facility]

## 2021-05-15 NOTE — Telephone Encounter (Signed)
Will need a visit to address

## 2021-05-15 NOTE — Telephone Encounter (Signed)
Pt was given muscle relaxer by Marylene Land on 05/08/2021 pt states that medication is not working.

## 2021-06-02 ENCOUNTER — Ambulatory Visit: Payer: No Typology Code available for payment source | Admitting: Pharmacist

## 2021-08-11 ENCOUNTER — Ambulatory Visit: Payer: No Typology Code available for payment source | Admitting: Family Medicine

## 2021-09-01 ENCOUNTER — Ambulatory Visit: Payer: Self-pay | Admitting: *Deleted

## 2021-09-01 NOTE — Telephone Encounter (Signed)
  Chief Complaint: pain in right arm , from shoulder to elbow and right eye swollen blurred vision from fall 5 weeks ago  Symptoms: unable to move right arm from shoulder to elbow due to fall on garbage 5 weeks ago , blurred vision right eye Frequency: na  Pertinent Negatives: Patient denies dizziness, lightheadedness now  Disposition: [x] ED /[x] Urgent Care (no appt availability in office) / [] Appointment(In office/virtual)/ []  [facility]/ [] Home Care/ [] Refused Recommended Disposition  Additional Notes: information verified by interpreter [phone]. Patient stepped on garbage and fell 5 weeks ago hitting right side of head and right shoulder and both legs. forward and hit trash can. Unable to move right upper arm from shoulder to elbow and blurry vision from right eye. Bruising and swelling to right eye and right upper arm. Did not go to ED 5 weeks ago . Instructed to go to ED due to sx for possible x rays . Please advise .

## 2021-09-01 NOTE — Telephone Encounter (Signed)
Called pt stated she will go to the UC /ED

## 2021-09-01 NOTE — Telephone Encounter (Signed)
Reason for Disposition  [1] MODERATE weakness (i.e., interferes with work, school, normal activities) AND [2] new-onset or worsening  Answer Assessment - Initial Assessment Questions 1. MECHANISM: "How did the fall happen?"     Slipped and fell forward on garbage x 5 weeks ago  2. DOMESTIC VIOLENCE AND ELDER ABUSE SCREENING: "Did you fall because someone pushed you or tried to hurt you?" If Yes, ask: "Are you safe now?"     na 3. ONSET: "When did the fall happen?" (e.g., minutes, hours, or days ago)     5 weeks ago  4. LOCATION: "What part of the body hit the ground?" (e.g., back, buttocks, head, hips, knees, hands, head, stomach)     Right forehead and right shoulder and both legs  5. INJURY: "Did you hurt (injure) yourself when you fell?" If Yes, ask: "What did you injure? Tell me more about this?" (e.g., body area; type of injury; pain severity)"     Yes , right arm "can't move" from shoulder to elbow . Blurred vision to right eye from fall  6. PAIN: "Is there any pain?" If Yes, ask: "How bad is the pain?" (e.g., Scale 1-10; or mild,  moderate, severe)   - NONE (0): No pain   - MILD (1-3): Doesn't interfere with normal activities    - MODERATE (4-7): Interferes with normal activities or awakens from sleep    - SEVERE (8-10): Excruciating pain, unable to do any normal activities      Moderate to severe comes and goes  7. SIZE: For cuts, bruises, or swelling, ask: "How large is it?" (e.g., inches or centimeters)      Yes bruising and swelling right shoulder.  8. PREGNANCY: "Is there any chance you are pregnant?" "When was your last menstrual period?"     Na  9. OTHER SYMPTOMS: "Do you have any other symptoms?" (e.g., dizziness, fever, weakness; new onset or worsening).      Dizziness not now. , weakness right arm  10. CAUSE: "What do you think caused the fall (or falling)?" (e.g., tripped, dizzy spell)       Stepped on garbage and fell  Protocols used: Falls and [facility]

## 2021-09-11 ENCOUNTER — Ambulatory Visit: Payer: Self-pay | Admitting: *Deleted

## 2021-09-11 NOTE — Telephone Encounter (Signed)
°  Chief Complaint: shoulder injury Symptoms: severe pain, numbness in arm, unable to move arm Frequency: 30 days Pertinent Negatives: Patient denies fever Disposition: [x] ED /[] Urgent Care (no appt availability in office) / [] Appointment(In office/virtual)/ []  [place] [facility]/ [] Home Care/ [] Refused Recommended Disposition  Additional Notes: Advised ED- not sure patient will go- may not have transportation- Interpreter: [phone]  Reason for Disposition  Can't move injured shoulder at all  Answer Assessment - Initial Assessment Questions 1. ONSET: "When did the pain start?"     Various days- accident and it has not gotten better- 30 days 2. LOCATION: "Where is the pain located?"     R shoulder 3. PAIN: "How bad is the pain?" (Scale 1-10; or mild, moderate, severe)   - MILD (1-3): doesn't interfere with normal activities   - MODERATE (4-7): interferes with normal activities (e.g., work or school) or awakens from sleep   - SEVERE (8-10): excruciating pain, unable to do any normal activities, unable to move arm at all due to pain     severe 4. WORK OR EXERCISE: "Has there been any recent work or exercise that involved this part of the body?"     no 5. CAUSE: "What do you think is causing the shoulder pain?"     Patient fell at apartment- hitting shoulder  6. OTHER SYMPTOMS: "Do you have any other symptoms?" (e.g., neck pain, swelling, rash, fever, numbness, weakness)     Swelling, numbness,weakness present 7. PREGNANCY: "Is there any chance you are pregnant?" "When was your last menstrual period?"     na  Protocols used: Shoulder Pain-A-AH, Shoulder Injury-A-AH

## 2021-09-11 NOTE — Telephone Encounter (Signed)
Patient calling with shoulder pain- call lost during transfer.  Called patient -interpreter: [phone] Left message for patient to return call.

## 2021-09-12 ENCOUNTER — Emergency Department (HOSPITAL_COMMUNITY): Payer: No Typology Code available for payment source

## 2021-09-12 ENCOUNTER — Emergency Department (HOSPITAL_COMMUNITY)
Admission: EM | Admit: 2021-09-12 | Discharge: 2021-09-12 | Disposition: A | Discharge status: Home / Self Care | Payer: No Typology Code available for payment source | Attending: Emergency Medicine | Admitting: Emergency Medicine

## 2021-09-12 DIAGNOSIS — W01198A Fall on same level from slipping, tripping and stumbling with subsequent striking against other object, initial encounter: Secondary | ICD-10-CM | POA: Insufficient documentation

## 2021-09-12 DIAGNOSIS — Z79899 Other long term (current) drug therapy: Secondary | ICD-10-CM | POA: Insufficient documentation

## 2021-09-12 DIAGNOSIS — I10 Essential (primary) hypertension: Secondary | ICD-10-CM | POA: Insufficient documentation

## 2021-09-12 DIAGNOSIS — S43004A Unspecified dislocation of right shoulder joint, initial encounter: Secondary | ICD-10-CM | POA: Insufficient documentation

## 2021-09-12 DIAGNOSIS — I480 Paroxysmal atrial fibrillation: Secondary | ICD-10-CM | POA: Insufficient documentation

## 2021-09-12 DIAGNOSIS — Z7982 Long term (current) use of aspirin: Secondary | ICD-10-CM | POA: Insufficient documentation

## 2021-09-12 DIAGNOSIS — R519 Headache, unspecified: Secondary | ICD-10-CM | POA: Insufficient documentation

## 2021-09-12 LAB — CBC WITH DIFFERENTIAL/PLATELET
Abs Immature Granulocytes: 0.01 10*3/uL (ref 0.00–0.07)
Basophils Absolute: 0 10*3/uL (ref 0.0–0.1)
Basophils Relative: 1 %
Eosinophils Absolute: 0.1 10*3/uL (ref 0.0–0.5)
Eosinophils Relative: 2 %
HCT: 39.1 % (ref 36.0–46.0)
Hemoglobin: 12.8 g/dL (ref 12.0–15.0)
Immature Granulocytes: 0 %
Lymphocytes Relative: 36 %
Lymphs Abs: 1.9 10*3/uL (ref 0.7–4.0)
MCH: 28.2 pg (ref 26.0–34.0)
MCHC: 32.7 g/dL (ref 30.0–36.0)
MCV: 86.1 fL (ref 80.0–100.0)
Monocytes Absolute: 0.4 10*3/uL (ref 0.1–1.0)
Monocytes Relative: 8 %
Neutro Abs: 2.9 10*3/uL (ref 1.7–7.7)
Neutrophils Relative %: 53 %
Platelets: 300 10*3/uL (ref 150–400)
RBC: 4.54 MIL/uL (ref 3.87–5.11)
RDW: 13.9 % (ref 11.5–15.5)
WBC: 5.3 10*3/uL (ref 4.0–10.5)
nRBC: 0 % (ref 0.0–0.2)

## 2021-09-12 LAB — BASIC METABOLIC PANEL
Anion gap: 7 (ref 5–15)
BUN: 11 mg/dL (ref 8–23)
CO2: 27 mmol/L (ref 22–32)
Calcium: 9 mg/dL (ref 8.9–10.3)
Chloride: 104 mmol/L (ref 98–111)
Creatinine, Ser: 0.67 mg/dL (ref 0.44–1.00)
GFR, Estimated: >60 mL/min (ref >60)
Glucose, Bld: 99 mg/dL (ref 70–99)
Potassium: 3.3 mmol/L — ABNORMAL LOW (ref 3.5–5.1)
Sodium: 138 mmol/L (ref 135–145)

## 2021-09-12 MED ORDER — HYDRALAZINE HCL 25 MG PO TABS
50.0000 mg | ORAL_TABLET | Freq: Three times a day (TID) | ORAL | 0 refills | Status: AC
  Filled 2021-09-12: qty 180, 30d supply, fill #0

## 2021-09-12 MED ORDER — HYDRALAZINE HCL 25 MG PO TABS
50.0000 mg | ORAL_TABLET | Freq: Once | ORAL | Status: AC
  Administered 2021-09-12: 17:00:00 50 mg via ORAL
  Filled 2021-09-12: qty 2

## 2021-09-12 MED ORDER — MIDAZOLAM HCL 2 MG/2ML IJ SOLN: 2.0000 mg | Freq: Once | INTRAMUSCULAR | Status: DC

## 2021-09-12 MED ORDER — ASPIRIN 81 MG PO CHEW
81.0000 mg | CHEWABLE_TABLET | Freq: Every day | ORAL | 0 refills | Status: AC
  Filled 2021-09-12: qty 30, 30d supply, fill #0

## 2021-09-12 MED ORDER — CLONIDINE HCL 0.2 MG PO TABS
0.2000 mg | ORAL_TABLET | Freq: Once | ORAL | Status: AC
  Administered 2021-09-12: 18:00:00 0.2 mg via ORAL
  Filled 2021-09-12: qty 1

## 2021-09-12 MED ORDER — LIDOCAINE HCL (PF) 1 % IJ SOLN: 10.0000 mL | Freq: Once | INTRAMUSCULAR | Status: DC

## 2021-09-12 NOTE — ED Provider Notes (Signed)
Emergency Medicine Provider Triage Evaluation Note  Shelia [name] , a 65 y.o. female  was evaluated in triage.  Pt complains of fall that occurred about 1 month ago.  She states she tripped over a trash container.  States she hit her head and her right shoulder.  She complains of ongoing headache, visual change, right shoulder pain with limited range of motion.  Denies other complaints.  She is not on anticoagulation.  Review of Systems  Positive: Shoulder pain, headache, vision change Negative: Chest pain, shortness of breath  Physical Exam  BP (!) 203/108 (BP Location: Left Arm)    Pulse 73    Temp 98.5 F (36.9 C) (Oral)    Resp 14    SpO2 99%  Gen:   Awake, no distress   Resp:  Normal effort  MSK:   Moves extremities without difficulty  Other:  Right shoulder with tenderness to palpation.  Limited range of motion secondary to pain.   Medical Decision Making  Medically screening exam initiated at 9:51 AM.  Appropriate orders placed.  Shelia [name] was informed that the remainder of the evaluation will be completed by another provider, this initial triage assessment does not replace that evaluation, and the importance of remaining in the ED until their evaluation is complete.     Marita Kansas, PA-C 09/12/21 7614    Gerhard Munch, MD 09/12/21 [phone]

## 2021-09-12 NOTE — ED Provider Notes (Signed)
[facility] EMERGENCY DEPARTMENT Provider Note   CSN: [id number] Arrival date & time: 09/12/21  4098     History Chief Complaint  Patient presents with   Fall   Shoulder Injury   Arm Pain   Headache   Blurred Vision    Shelia [name] is a 65 y.o. female.  Presents chief complaint of right shoulder pain.  She states that she fell approximately 5 to 6 weeks ago while tripping over a trash can.  She sustained shoulder injury then but never sought medical assistance.  Symptoms did not improve so she presents to ER today.  She also states that she hit her head at the time and had persistent tenderness in achiness to the right side of her head.  Otherwise no reports of fevers or cough or vomiting or diarrhea.      Past Medical History:  Diagnosis Date   Hypertension     Patient Active Problem List   Diagnosis Date Noted   Chronic midline low back pain without sciatica 05/08/2021   Chest wall pain 05/08/2021   Pre-diabetes 10/17/2017   Abnormal LFTs 07/02/2017   Essential hypertension 12/24/2016   Microalbuminuria 12/24/2016    Past Surgical History:  Procedure Laterality Date   NO PAST SURGERIES       OB History     Gravida  3   Para      Term      Preterm      AB      Living         SAB      IAB      Ectopic      Multiple      Live Births  3           Family History  Problem Relation Age of Onset   Diabetes Father    Hypertension Neg Hx    CAD Neg Hx    Cancer Neg Hx     Social History   Tobacco Use   Smoking status: Never   Smokeless tobacco: Never  Vaping Use   Vaping Use: Never used  Substance Use Topics   Alcohol use: No   Drug use: No    Home Medications Prior to Admission medications   Medication Sig Start Date End Date Taking? Authorizing Provider  aspirin 81 MG chewable tablet Chew 1 tablet (81 mg total) by mouth daily. 09/12/21 10/12/21 Yes Cheryll Cockayne, MD  hydrALAZINE (APRESOLINE)  25 MG tablet Take 2 tablets (50 mg total) by mouth 3 (three) times daily. 09/12/21 10/12/21 Yes Cheryll Cockayne, MD  amLODipine (NORVASC) 10 MG tablet TAKE 1 TABLET (10 MG TOTAL) BY MOUTH DAILY. 05/08/21 05/08/22  Anders Simmonds, PA-C  chlorthalidone (HYGROTON) 25 MG tablet TAKE 1 TABLET (25 MG TOTAL) BY MOUTH DAILY. TO LOWER BLOOD PRESSURE 05/08/21 05/08/22  Anders Simmonds, PA-C  cyclobenzaprine (FLEXERIL) 5 MG tablet Take 1 tablet (5 mg total) by mouth 3 (three) times daily as needed for muscle spasms. 05/08/21   Anders Simmonds, PA-C  gabapentin (NEURONTIN) 300 MG capsule TAKE 1 CAPSULE (300 MG TOTAL) BY MOUTH 2 (TWO) TIMES DAILY. 07/01/20 07/01/21  Hoy Register, MD  hydrocortisone cream 1 % Apply 1 application topically 2 (two) times daily. 07/01/20   Hoy Register, MD  naproxen (NAPROSYN) 500 MG tablet TAKE 1 TABLET (500 MG TOTAL) BY MOUTH 2 (TWO) TIMES DAILY WITH A MEAL AS NEEDED FOR PAIN 10/10/20 10/10/21  Anders Simmonds,  PA-C  olopatadine (PATANOL) 0.1 % ophthalmic solution PLACE 1 DROP INTO BOTH EYES 2 (TWO) TIMES DAILY. 10/10/20 10/10/21  Anders Simmonds, PA-C  predniSONE (DELTASONE) 10 MG tablet 6,5,4,3,2,1 take each days dose in am with food 05/08/21   Georgian Co M, PA-C  lisinopril (ZESTRIL) 20 MG tablet Take 1 tablet (20 mg total) by mouth daily. To lower blood pressure 07/01/20 10/10/20  Hoy Register, MD    Allergies    Lisinopril  Review of Systems   Review of Systems  Constitutional:  Negative for fever.  HENT:  Negative for ear pain.   Eyes:  Negative for pain.  Respiratory:  Negative for cough.   Cardiovascular:  Negative for chest pain.  Gastrointestinal:  Negative for abdominal pain.  Genitourinary:  Negative for flank pain.  Musculoskeletal:  Negative for back pain.  Skin:  Negative for rash.  Neurological:  Negative for seizures.   Physical Exam Updated Vital Signs BP (!) 141/85    Pulse 66    Temp 97.7 F (36.5 C) (Oral)    Resp 16    SpO2 98%    Physical Exam Constitutional:      General: She is not in acute distress.    Appearance: Normal appearance.  HENT:     Head: Normocephalic.     Nose: Nose normal.  Eyes:     Extraocular Movements: Extraocular movements intact.  Cardiovascular:     Rate and Rhythm: Normal rate.  Pulmonary:     Effort: Pulmonary effort is normal.  Musculoskeletal:        General: Normal range of motion.     Cervical back: Normal range of motion.     Comments: Right shoulder deformity present empty sulcus sign is present.  Otherwise neurovascularly intact distally.  Radial and median nerve intact.  Compartment soft.  Neurological:     General: No focal deficit present.     Mental Status: She is alert. Mental status is at baseline.    ED Results / Procedures / Treatments   Labs (all labs ordered are listed, but only abnormal results are displayed) Labs Reviewed  BASIC METABOLIC PANEL - Abnormal; Notable for the following components:      Result Value   Potassium 3.3 (*)    All other components within normal limits  CBC WITH DIFFERENTIAL/PLATELET    EKG EKG Interpretation  Date/Time:  Friday September 12 2021 18:59:40 EST Ventricular Rate:  69 PR Interval:  158 QRS Duration: 84 QT Interval:  412 QTC Calculation: 442 R Axis:   8 Text Interpretation: Sinus rhythm LVH by voltage Confirmed by Norman Clay ([id number]) on 09/12/2021 7:02:09 PM  Radiology DG Shoulder Right  Result Date: 09/12/2021 CLINICAL DATA:  Right shoulder pain after fall EXAM: RIGHT SHOULDER - 2+ VIEW COMPARISON:  05/01/2021 FINDINGS: Humeral head is anteroinferiorly displaced relative to the glenoid. Suspect small Hill-Sachs fracture of the humeral head. No definite glenoid fracture is seen on the included projections, attention on postreduction films. AC joint intact with moderate osteoarthritis. IMPRESSION: Anteroinferior dislocation of the right shoulder. Suspect small Hill-Sachs fracture of the humeral head.  Electronically Signed   By: Duanne Guess D.O.   On: 09/12/2021 10:18   CT Head Wo Contrast  Result Date: 09/12/2021 CLINICAL DATA:  Headache, vision change.  Fall. EXAM: CT HEAD WITHOUT CONTRAST TECHNIQUE: Contiguous axial images were obtained from the base of the skull through the vertex without intravenous contrast. COMPARISON:  None. FINDINGS: Brain: There is no evidence of acute  intracranial hemorrhage, extra-axial fluid collection, or acute infarct. Parenchymal volume is normal. The ventricles are normal in size. 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 Additional patchy hypodensity in the subcortical and periventricular white matter likely reflects sequela of chronic white matter microangiopathy. There is no mass lesion.  There is no midline shift. Vascular: No hyperdense vessel or unexpected calcification. Skull: Normal. Negative for fracture or focal lesion. Sinuses/Orbits: The imaged paranasal sinuses are clear. The globes and orbits are unremarkable. Other: There is minimal right periorbital soft tissue swelling. IMPRESSION: 1. No acute intracranial pathology. 2. Scattered small remote lacunar infarcts on a background of mild chronic white matter microangiopathy. 3. Minimal right periorbital soft tissue swelling. Electronically Signed   By: Lesia Hausen M.D.   On: 09/12/2021 10:33   CT Shoulder Right Wo Contrast  Result Date: 09/12/2021 CLINICAL DATA:  Shoulder trauma, injury, pain, question fracture or dislocation EXAM: CT OF THE UPPER RIGHT EXTREMITY WITHOUT CONTRAST TECHNIQUE: Multidetector CT imaging of the upper right extremity was performed according to the standard protocol. COMPARISON:  Radiographs 09/12/2021, 05/01/2021 FINDINGS: Bones/Joint/Cartilage Osseous demineralization. Anterior inferior RIGHT glenohumeral dislocation. Minimal impaction deformity of the humeral head. Small bony fracture from the inferior glenoid rim. Additional  thin calcific fracture fragments are seen adjacent to the proximal humerus laterally likely of humeral origin. Coracoid process is intact. Visualized ribs intact. AC joint alignment normal. Ligaments Suboptimally assessed by CT. Muscles and Tendons Muscular planes unremarkable. Soft tissues Fluid/blood within glenohumeral joint.  Visualized RIGHT lung clear. IMPRESSION: Anterior inferior LEFT Leno humeral dislocation. Small fracture fragments adjacent to the proximal humerus laterally, likely of humeral origin. Small displaced inferior glenoid fracture fragment. Electronically Signed   By: Ulyses Southward M.D.   On: 09/12/2021 18:29    Procedures Procedures   Medications Ordered in ED Medications  hydrALAZINE (APRESOLINE) tablet 50 mg (50 mg Oral Given 09/12/21 1639)  cloNIDine (CATAPRES) tablet 0.2 mg (0.2 mg Oral Given 09/12/21 1819)    ED Course  I have reviewed the triage vital signs and the nursing notes.  Pertinent labs & imaging results that were available during my care of the patient were reviewed by me and considered in my medical decision making (see chart for details).    MDM Rules/Calculators/A&P                         Case discussed with on-call orthopedist.  Recommending CT imaging sling and outpatient follow-up with specialist.  Patient given phone number information to call.  Patient did have an episode of atrial fibrillation here but appears transient is now in sinus rhythm.  Blood pressure significantly elevated but improved with medication provided.  Patient states that she used to be on medication does not recall what it was and just ended up not taking any more medications and not following up.  I stressed to her the importance of following up with her doctor given that she had a run of atrial fibrillation as well as uncontrolled high blood pressure requiring close monitoring.  Advising outpatient follow-up with her primary care doctor's team within 1 to 2 weeks and the  orthopedist within the week.  Advising return if she has pain fevers difficulty breathing or any additional concerns.     Final Clinical Impression(s) / ED Diagnoses Final diagnoses:  Dislocation of right shoulder joint, initial encounter  Hypertension, unspecified type  Paroxysmal atrial fibrillation (HCC)    Rx /  DC Orders ED Discharge Orders          Ordered    hydrALAZINE (APRESOLINE) 25 MG tablet  3 times daily        09/12/21 1906    aspirin 81 MG chewable tablet  Daily        09/12/21 1906             Cheryll Cockayne, MD 09/12/21 2049

## 2021-09-12 NOTE — ED Triage Notes (Signed)
Pt/ translator stated,  I fell over a month ago and Im still having right arm, shoulder and head pian. I have some blurred vision. I fell Over a trash can

## 2021-09-12 NOTE — Discharge Instructions (Addendum)
Take daily 81 mg aspirin. Take the blood pressure medications I have written for you today 3 times a day.  You will need to follow-up with your primary care doctor within the week to manage her blood pressure and irregular heartbeat.  Regarding your right shoulder dislocation, you will need to follow-up with orthopedic specialist within the week.  Call their number to schedule appointment

## 2021-09-12 NOTE — ED Notes (Signed)
Patient transported to CT 

## 2021-09-12 NOTE — Progress Notes (Signed)
Orthopedic Tech Progress Note Patient Details:  Carolann Brazell Sep 10, 1956 [id number]  Ortho Devices Type of Ortho Device: Shoulder immobilizer Ortho Device/Splint Location: right Ortho Device/Splint Interventions: Ordered, Application, Adjustment   Post Interventions Patient Tolerated: Well Instructions Provided: Adjustment of device  Delorise Royals Yareth Macdonnell 09/12/2021, 6:52 PM Applied shoulder immobilizer. Lower strap needs to be attached when patient can stand.

## 2021-09-12 NOTE — ED Notes (Signed)
EKG done on 09/12/2021 1025 is not the correct pt. Please disregard same.

## 2021-09-12 NOTE — ED Notes (Signed)
Ortho tech called for shoulder immobilizer.  

## 2021-09-16 ENCOUNTER — Other Ambulatory Visit: Payer: Self-pay

## 2021-09-19 ENCOUNTER — Other Ambulatory Visit: Payer: Self-pay

## 2021-09-19 ENCOUNTER — Encounter (HOSPITAL_COMMUNITY): Payer: Self-pay | Admitting: Orthopedic Surgery

## 2021-09-19 NOTE — Progress Notes (Addendum)
COVID swab appointment: N/A  COVID Vaccine Completed:  No Date COVID Vaccine completed: Has received booster: COVID vaccine manufacturer: Pfizer    [employer]   Date of COVID positive in last 90 days:  No  PCP - Hoy Register, MD Cardiologist - N/A  Chest x-ray - N/A EKG - 09-12-21 Epic Stress Test - N/A ECHO - N/A Cardiac Cath - N/A Pacemaker/ICD device last checked: Spinal Cord Stimulator:  Sleep Study - N/A CPAP -   Fasting Blood Sugar -  Checks Blood Sugar - Does not check for prediabetes  Blood Thinner Instructions:  N/A Aspirin Instructions:  ASA 81 mg, currently not taking, has not taken in months Last Dose:  Activity level:  Can go up a flight of stairs and perform activities of daily living without stopping and without symptoms of chest pain or shortness of breath.    Anesthesia review:  N/A  Patient denies shortness of breath, fever, cough and chest pain at PAT appointment (Completed over the phone with interpreter Diego from [employer])  Patient verbalized understanding of instructions that were given to them at the PAT appointment. Patient was also instructed that they will need to review over the PAT instructions again at home before surgery.

## 2021-09-19 NOTE — Progress Notes (Signed)
Surgery orders requested via Epic inbox. °

## 2021-09-23 ENCOUNTER — Ambulatory Visit (HOSPITAL_COMMUNITY): Payer: No Typology Code available for payment source | Admitting: Anesthesiology

## 2021-09-23 ENCOUNTER — Encounter (HOSPITAL_COMMUNITY): Payer: Self-pay | Admitting: Orthopedic Surgery

## 2021-09-23 ENCOUNTER — Ambulatory Visit (HOSPITAL_COMMUNITY)
Admission: RE | Admit: 2021-09-23 | Discharge: 2021-09-23 | Disposition: A | Discharge status: Home / Self Care | Payer: No Typology Code available for payment source | Source: Ambulatory Visit | Attending: Orthopedic Surgery | Admitting: Orthopedic Surgery

## 2021-09-23 ENCOUNTER — Ambulatory Visit (HOSPITAL_COMMUNITY): Payer: No Typology Code available for payment source

## 2021-09-23 ENCOUNTER — Encounter (HOSPITAL_COMMUNITY)
Admission: RE | Disposition: A | Discharge status: Home / Self Care | Payer: Self-pay | Source: Ambulatory Visit | Attending: Orthopedic Surgery

## 2021-09-23 DIAGNOSIS — R0789 Other chest pain: Secondary | ICD-10-CM

## 2021-09-23 DIAGNOSIS — Z419 Encounter for procedure for purposes other than remedying health state, unspecified: Secondary | ICD-10-CM

## 2021-09-23 DIAGNOSIS — M25511 Pain in right shoulder: Secondary | ICD-10-CM

## 2021-09-23 DIAGNOSIS — G8929 Other chronic pain: Secondary | ICD-10-CM

## 2021-09-23 DIAGNOSIS — Z7982 Long term (current) use of aspirin: Secondary | ICD-10-CM | POA: Insufficient documentation

## 2021-09-23 DIAGNOSIS — Z79899 Other long term (current) drug therapy: Secondary | ICD-10-CM | POA: Insufficient documentation

## 2021-09-23 DIAGNOSIS — M545 Low back pain, unspecified: Secondary | ICD-10-CM

## 2021-09-23 DIAGNOSIS — R7303 Prediabetes: Secondary | ICD-10-CM

## 2021-09-23 DIAGNOSIS — I10 Essential (primary) hypertension: Secondary | ICD-10-CM | POA: Insufficient documentation

## 2021-09-23 DIAGNOSIS — M24411 Recurrent dislocation, right shoulder: Secondary | ICD-10-CM | POA: Insufficient documentation

## 2021-09-23 DIAGNOSIS — Z791 Long term (current) use of non-steroidal anti-inflammatories (NSAID): Secondary | ICD-10-CM | POA: Insufficient documentation

## 2021-09-23 DIAGNOSIS — M62838 Other muscle spasm: Secondary | ICD-10-CM

## 2021-09-23 HISTORY — PX: REVERSE SHOULDER ARTHROPLASTY: SHX5054

## 2021-09-23 HISTORY — PX: SHOULDER OPEN ROTATOR CUFF REPAIR: SHX2407

## 2021-09-23 LAB — HEMOGLOBIN A1C
Hgb A1c MFr Bld: 5.7 % — ABNORMAL HIGH (ref 4.8–5.6)
Mean Plasma Glucose: 116.89 mg/dL

## 2021-09-23 SURGERY — ARTHROPLASTY, SHOULDER, TOTAL, REVERSE
Anesthesia: Regional | Site: Shoulder | Laterality: Right

## 2021-09-23 MED ORDER — LIDOCAINE 2% (20 MG/ML) 5 ML SYRINGE
INTRAMUSCULAR | Status: DC | PRN
  Administered 2021-09-23: 20 mg via INTRAVENOUS

## 2021-09-23 MED ORDER — LABETALOL HCL 5 MG/ML IV SOLN
10.0000 mg | Freq: Once | INTRAVENOUS | Status: AC
  Administered 2021-09-23: 13:00:00 10 mg via INTRAVENOUS

## 2021-09-23 MED ORDER — ORAL CARE MOUTH RINSE: 15.0000 mL | Freq: Once | OROMUCOSAL | Status: AC

## 2021-09-23 MED ORDER — BUPIVACAINE HCL (PF) 0.5 % IJ SOLN
INTRAMUSCULAR | Status: DC | PRN
  Administered 2021-09-23: 15 mL via PERINEURAL

## 2021-09-23 MED ORDER — ONDANSETRON HCL 4 MG/2ML IJ SOLN
INTRAMUSCULAR | Status: DC | PRN
  Administered 2021-09-23: 4 mg via INTRAVENOUS

## 2021-09-23 MED ORDER — DEXAMETHASONE SODIUM PHOSPHATE 4 MG/ML IJ SOLN
INTRAMUSCULAR | Status: DC | PRN
  Administered 2021-09-23: 4 mg via INTRAVENOUS

## 2021-09-23 MED ORDER — HYDRALAZINE HCL 20 MG/ML IJ SOLN
INTRAMUSCULAR | Status: AC
  Filled 2021-09-23: qty 1

## 2021-09-23 MED ORDER — PROPOFOL 10 MG/ML IV BOLUS
INTRAVENOUS | Status: DC | PRN
  Administered 2021-09-23: 130 mg via INTRAVENOUS

## 2021-09-23 MED ORDER — PHENYLEPHRINE 40 MCG/ML (10ML) SYRINGE FOR IV PUSH (FOR BLOOD PRESSURE SUPPORT)
PREFILLED_SYRINGE | INTRAVENOUS | Status: AC
  Filled 2021-09-23: qty 10

## 2021-09-23 MED ORDER — LABETALOL HCL 5 MG/ML IV SOLN
INTRAVENOUS | Status: AC
  Filled 2021-09-23: qty 4

## 2021-09-23 MED ORDER — FENTANYL CITRATE (PF) 100 MCG/2ML IJ SOLN
INTRAMUSCULAR | Status: DC | PRN
  Administered 2021-09-23 (×2): 50 ug via INTRAVENOUS

## 2021-09-23 MED ORDER — ONDANSETRON HCL 4 MG PO TABS
4.0000 mg | ORAL_TABLET | Freq: Three times a day (TID) | ORAL | 0 refills | Status: AC | PRN
  Filled 2021-09-23: qty 10, 4d supply, fill #0

## 2021-09-23 MED ORDER — PROPOFOL 10 MG/ML IV BOLUS
INTRAVENOUS | Status: AC
  Filled 2021-09-23: qty 20

## 2021-09-23 MED ORDER — VANCOMYCIN HCL 1000 MG IV SOLR
INTRAVENOUS | Status: AC
  Filled 2021-09-23: qty 20

## 2021-09-23 MED ORDER — SUGAMMADEX SODIUM 200 MG/2ML IV SOLN
INTRAVENOUS | Status: DC | PRN
Start: 2021-09-23 — End: 2021-09-23
  Administered 2021-09-23: 200 mg via INTRAVENOUS

## 2021-09-23 MED ORDER — CEFAZOLIN SODIUM-DEXTROSE 2-4 GM/100ML-% IV SOLN
INTRAVENOUS | Status: AC
  Filled 2021-09-23: qty 100

## 2021-09-23 MED ORDER — BUPIVACAINE LIPOSOME 1.3 % IJ SUSP
INTRAMUSCULAR | Status: DC | PRN
  Administered 2021-09-23: 10 mL via PERINEURAL

## 2021-09-23 MED ORDER — LACTATED RINGERS IV BOLUS
500.0000 mL | Freq: Once | INTRAVENOUS | Status: AC
  Administered 2021-09-23: 500 mL via INTRAVENOUS

## 2021-09-23 MED ORDER — CYCLOBENZAPRINE HCL 10 MG PO TABS
10.0000 mg | ORAL_TABLET | Freq: Three times a day (TID) | ORAL | 1 refills | Status: AC | PRN
  Filled 2021-09-23: qty 30, 10d supply, fill #0

## 2021-09-23 MED ORDER — FENTANYL CITRATE (PF) 100 MCG/2ML IJ SOLN
INTRAMUSCULAR | Status: AC
  Filled 2021-09-23: qty 2

## 2021-09-23 MED ORDER — ACETAMINOPHEN 500 MG PO TABS
1000.0000 mg | ORAL_TABLET | Freq: Once | ORAL | Status: AC
  Administered 2021-09-23: 1000 mg via ORAL
  Filled 2021-09-23: qty 2

## 2021-09-23 MED ORDER — MIDAZOLAM HCL 2 MG/2ML IJ SOLN
INTRAMUSCULAR | Status: AC
  Filled 2021-09-23: qty 2

## 2021-09-23 MED ORDER — TRANEXAMIC ACID-NACL 1000-0.7 MG/100ML-% IV SOLN
1000.0000 mg | INTRAVENOUS | Status: AC
  Administered 2021-09-23: 1000 mg via INTRAVENOUS
  Filled 2021-09-23: qty 100

## 2021-09-23 MED ORDER — PHENYLEPHRINE HCL-NACL 20-0.9 MG/250ML-% IV SOLN
INTRAVENOUS | Status: DC | PRN
  Administered 2021-09-23: 80 ug/min via INTRAVENOUS

## 2021-09-23 MED ORDER — 0.9 % SODIUM CHLORIDE (POUR BTL) OPTIME
TOPICAL | Status: DC | PRN
  Administered 2021-09-23: 1000 mL

## 2021-09-23 MED ORDER — CEFAZOLIN SODIUM-DEXTROSE 2-4 GM/100ML-% IV SOLN
2.0000 g | INTRAVENOUS | Status: AC
  Administered 2021-09-23: 2 g via INTRAVENOUS
  Filled 2021-09-23: qty 100

## 2021-09-23 MED ORDER — CYCLOBENZAPRINE HCL 5 MG PO TABS
5.0000 mg | ORAL_TABLET | Freq: Three times a day (TID) | ORAL | 1 refills | Status: AC | PRN
  Filled 2021-09-23: qty 30, 10d supply, fill #0

## 2021-09-23 MED ORDER — LACTATED RINGERS IV SOLN: INTRAVENOUS | Status: DC

## 2021-09-23 MED ORDER — HYDRALAZINE HCL 20 MG/ML IJ SOLN
INTRAMUSCULAR | Status: DC | PRN
Start: 2021-09-23 — End: 2021-09-23
  Administered 2021-09-23: 10 mg via INTRAVENOUS

## 2021-09-23 MED ORDER — FENTANYL CITRATE PF 50 MCG/ML IJ SOSY
PREFILLED_SYRINGE | INTRAMUSCULAR | Status: AC
  Filled 2021-09-23: qty 2

## 2021-09-23 MED ORDER — DEXAMETHASONE SODIUM PHOSPHATE 10 MG/ML IJ SOLN
INTRAMUSCULAR | Status: AC
  Filled 2021-09-23: qty 1

## 2021-09-23 MED ORDER — FENTANYL CITRATE PF 50 MCG/ML IJ SOSY
50.0000 ug | PREFILLED_SYRINGE | Freq: Once | INTRAMUSCULAR | Status: AC
  Administered 2021-09-23: 50 ug via INTRAVENOUS

## 2021-09-23 MED ORDER — ROCURONIUM BROMIDE 100 MG/10ML IV SOLN
INTRAVENOUS | Status: DC | PRN
  Administered 2021-09-23: 10 mg via INTRAVENOUS
  Administered 2021-09-23: 70 mg via INTRAVENOUS

## 2021-09-23 MED ORDER — FENTANYL CITRATE PF 50 MCG/ML IJ SOSY: 25.0000 ug | PREFILLED_SYRINGE | INTRAMUSCULAR | Status: DC | PRN

## 2021-09-23 MED ORDER — CHLORHEXIDINE GLUCONATE 0.12 % MT SOLN
15.0000 mL | Freq: Once | OROMUCOSAL | Status: AC
  Administered 2021-09-23: 15 mL via OROMUCOSAL

## 2021-09-23 MED ORDER — OXYCODONE-ACETAMINOPHEN 5-325 MG PO TABS
1.0000 | ORAL_TABLET | ORAL | 0 refills | Status: DC | PRN
  Filled 2021-09-23: qty 20, 4d supply, fill #0

## 2021-09-23 MED ORDER — VANCOMYCIN HCL 1000 MG IV SOLR
INTRAVENOUS | Status: DC | PRN
  Administered 2021-09-23: 1000 mg via TOPICAL

## 2021-09-23 MED ORDER — NAPROXEN 500 MG PO TABS
ORAL_TABLET | ORAL | 1 refills | Status: AC
  Filled 2021-09-23: qty 60, fill #0

## 2021-09-23 MED ORDER — MIDAZOLAM HCL 2 MG/2ML IJ SOLN
2.0000 mg | Freq: Once | INTRAMUSCULAR | Status: AC
  Administered 2021-09-23: 2 mg via INTRAVENOUS

## 2021-09-23 SURGICAL SUPPLY — 81 items
ADH SKN CLS APL DERMABOND .7 (GAUZE/BANDAGES/DRESSINGS) ×1
AID PSTN UNV HD RSTRNT DISP (MISCELLANEOUS) ×1
ANCH SUT SWLK 19.1X4.75 VT (Anchor) IMPLANT
ANCHOR PEEK 4.75X19.1 SWLK C (Anchor) IMPLANT
APL PRP STRL LF DISP 70% ISPRP (MISCELLANEOUS)
APL SKNCLS STERI-STRIP NONHPOA (GAUZE/BANDAGES/DRESSINGS)
BAG COUNTER SPONGE SURGICOUNT (BAG) ×1 IMPLANT
BAG SPEC THK2 15X12 ZIP CLS (MISCELLANEOUS) ×1
BAG SPNG CNTER NS LX DISP (BAG) ×1
BAG ZIPLOCK 12X15 (MISCELLANEOUS) ×2 IMPLANT
BENZOIN TINCTURE PRP APPL 2/3 (GAUZE/BANDAGES/DRESSINGS) ×1 IMPLANT
BLADE SAW SGTL 83.5X18.5 (BLADE) ×1 IMPLANT
BOOTIES KNEE HIGH SLOAN (MISCELLANEOUS) ×2 IMPLANT
BSPLAT GLND +2X24 MDLR (Joint) ×1 IMPLANT
CHLORAPREP W/TINT 26 (MISCELLANEOUS) ×1 IMPLANT
COOLER ICEMAN CLASSIC (MISCELLANEOUS) ×1 IMPLANT
COVER BACK TABLE 60X90IN (DRAPES) ×1 IMPLANT
COVER SURGICAL LIGHT HANDLE (MISCELLANEOUS) ×2 IMPLANT
CUP SUT UNIV REVERS 36 NEUTRAL (Cup) ×1 IMPLANT
DECANTER SPIKE VIAL GLASS SM (MISCELLANEOUS) ×1 IMPLANT
DERMABOND ADVANCED (GAUZE/BANDAGES/DRESSINGS) ×1
DERMABOND ADVANCED .7 DNX12 (GAUZE/BANDAGES/DRESSINGS) IMPLANT
DRAPE INCISE IOBAN 66X45 STRL (DRAPES) ×1 IMPLANT
DRAPE ORTHO 2.5IN SPLIT 77X108 (DRAPES) ×2 IMPLANT
DRAPE ORTHO SPLIT 77X108 STRL (DRAPES) ×4
DRAPE SHEET LG 3/4 BI-LAMINATE (DRAPES) ×2 IMPLANT
DRAPE SURG 17X11 SM STRL (DRAPES) ×2 IMPLANT
DRAPE U-SHAPE 47X51 STRL (DRAPES) ×3 IMPLANT
DRSG ADAPTIC 3X8 NADH LF (GAUZE/BANDAGES/DRESSINGS) ×2 IMPLANT
DRSG AQUACEL AG ADV 3.5X 6 (GAUZE/BANDAGES/DRESSINGS) ×1 IMPLANT
DURAPREP 26ML APPLICATOR (WOUND CARE) ×1 IMPLANT
ELECT REM PT RETURN 15FT ADLT (MISCELLANEOUS) ×2 IMPLANT
FACESHIELD WRAPAROUND (MASK) ×12 IMPLANT
FACESHIELD WRAPAROUND OR TEAM (MASK) IMPLANT
GLENOID UNI REV MOD 24 +2 LAT (Joint) ×1 IMPLANT
GLENOSPHERE 36 +4 LAT/24 (Joint) ×1 IMPLANT
GLOVE SURG ENC MOIS LTX SZ7.5 (GLOVE) ×2 IMPLANT
GLOVE SURG ENC MOIS LTX SZ8 (GLOVE) ×2 IMPLANT
GLOVE SURG NEOP MICRO LF SZ7.5 (GLOVE) ×2 IMPLANT
INSERT HUMERAL UNI REVERS 36 6 (Insert) ×1 IMPLANT
KIT BASIN OR (CUSTOM PROCEDURE TRAY) ×2 IMPLANT
KIT TURNOVER KIT A (KITS) IMPLANT
MANIFOLD NEPTUNE II (INSTRUMENTS) ×2 IMPLANT
NDL MAYO 6 CRC TAPER PT (NEEDLE) ×1 IMPLANT
NDL MAYO CATGUT SZ4 TPR NDL (NEEDLE) ×1 IMPLANT
NDL TAPERED W/ NITINOL LOOP (MISCELLANEOUS) IMPLANT
NEEDLE MAYO 6 CRC TAPER PT (NEEDLE) IMPLANT
NEEDLE MAYO CATGUT SZ4 (NEEDLE) ×2 IMPLANT
NEEDLE TAPERED W/ NITINOL LOOP (MISCELLANEOUS) ×2 IMPLANT
NS IRRIG 1000ML POUR BTL (IV SOLUTION) ×2 IMPLANT
PACK SHOULDER (CUSTOM PROCEDURE TRAY) ×2 IMPLANT
PAD COLD SHLDR WRAP-ON (PAD) ×1 IMPLANT
PASSER SUT SWANSON 36MM LOOP (INSTRUMENTS) ×1 IMPLANT
PIN SET MODULAR GLENOID SYSTEM (PIN) ×1 IMPLANT
PROTECTOR NERVE ULNAR (MISCELLANEOUS) ×2 IMPLANT
RESTRAINT HEAD UNIVERSAL NS (MISCELLANEOUS) ×1 IMPLANT
SCREW CENTRAL MODULAR 25 (Screw) ×1 IMPLANT
SCREW PERI LOCK 5.5X16 (Screw) ×2 IMPLANT
SCREW PERI LOCK 5.5X24 (Screw) ×1 IMPLANT
SCREW PERI LOCK 5.5X32 (Screw) ×1 IMPLANT
SLEEVE SCD COMPRESS KNEE MED (STOCKING) ×1 IMPLANT
SLING ARM FOAM STRAP MED (SOFTGOODS) ×1 IMPLANT
SLING ARM IMMOBILIZER LRG (SOFTGOODS) IMPLANT
SLING ARM IMMOBILIZER MED (SOFTGOODS) IMPLANT
SPONGE T-LAP 18X18 ~~LOC~~+RFID (SPONGE) ×2 IMPLANT
SPONGE T-LAP 4X18 ~~LOC~~+RFID (SPONGE) ×1 IMPLANT
STEM HUMERAL MOD SZ 5 135 DEG (Stem) ×1 IMPLANT
STRIP CLOSURE SKIN 1/2X4 (GAUZE/BANDAGES/DRESSINGS) ×2 IMPLANT
SUT BONE WAX W31G (SUTURE) ×1 IMPLANT
SUT ETHIBOND 2 OS 4 DA (SUTURE) ×2 IMPLANT
SUT ETHIBOND NAB CT1 #1 30IN (SUTURE) ×2 IMPLANT
SUT MNCRL AB 3-0 PS2 18 (SUTURE) ×2 IMPLANT
SUT MON AB 2-0 CT1 36 (SUTURE) ×1 IMPLANT
SUT VIC AB 1 CT1 36 (SUTURE) ×2 IMPLANT
SUT VIC AB 2-0 CT1 27 (SUTURE) ×2
SUT VIC AB 2-0 CT1 TAPERPNT 27 (SUTURE) ×1 IMPLANT
SUTURE TAPE 1.3 40 TPR END (SUTURE) IMPLANT
SUTURETAPE 1.3 40 TPR END (SUTURE) ×6
TOWEL OR 17X26 10 PK STRL BLUE (TOWEL DISPOSABLE) ×1 IMPLANT
WATER STERILE IRR 1000ML POUR (IV SOLUTION) ×3 IMPLANT
YANKAUER SUCT BULB TIP 10FT TU (MISCELLANEOUS) ×2 IMPLANT

## 2021-09-23 NOTE — Anesthesia Preprocedure Evaluation (Addendum)
Anesthesia Evaluation  Patient identified by MRN, date of birth, ID band Patient awake    Reviewed: Allergy & Precautions, NPO status , Patient's Chart, lab work & pertinent test results  Airway Mallampati: III  TM Distance: >3 FB Neck ROM: Full    Dental  (+) Teeth Intact, Chipped, Loose,    Pulmonary neg pulmonary ROS,    Pulmonary exam normal breath sounds clear to auscultation       Cardiovascular hypertension, Pt. on medications Normal cardiovascular exam Rhythm:Regular Rate:Normal     Neuro/Psych negative neurological ROS  negative psych ROS   GI/Hepatic negative GI ROS, Neg liver ROS,   Endo/Other  negative endocrine ROS  Renal/GU negative Renal ROS  negative genitourinary   Musculoskeletal negative musculoskeletal ROS (+)   Abdominal   Peds  Hematology negative hematology ROS (+)   Anesthesia Other Findings   Reproductive/Obstetrics                            Anesthesia Physical Anesthesia Plan  ASA: 2  Anesthesia Plan: General and Regional   Post-op Pain Management: Regional block and Tylenol PO (pre-op)   Induction: Intravenous  PONV Risk Score and Plan: 3 and Midazolam, Dexamethasone and Ondansetron  Airway Management Planned: Oral ETT  Additional Equipment:   Intra-op Plan:   Post-operative Plan: Extubation in OR  Informed Consent: I have reviewed the patients History and Physical, chart, labs and discussed the procedure including the risks, benefits and alternatives for the proposed anesthesia with the patient or authorized representative who has indicated his/her understanding and acceptance.     Dental advisory given  Plan Discussed with: CRNA  Anesthesia Plan Comments:         Anesthesia Quick Evaluation

## 2021-09-23 NOTE — Transfer of Care (Signed)
Immediate Anesthesia Transfer of Care Note  Patient: Shelia [name]  Procedure(s) Performed: Closed and open reduction right shoulder,  rotator cuff repair (Right: Shoulder) REVERSE SHOULDER ARTHROPLASTY (Right: Shoulder)  Patient Location: PACU  Anesthesia Type:GA combined with regional for post-op pain  Level of Consciousness: drowsy and patient cooperative  Airway & Oxygen Therapy: Patient Spontanous Breathing and Patient connected to face mask oxygen  Post-op Assessment: Report given to RN and Post -op Vital signs reviewed and stable  Post vital signs: Reviewed and stable  Last Vitals:  Vitals Value Taken Time  BP 154/68 09/23/21 1805  Temp    Pulse 79 09/23/21 1808  Resp 20 09/23/21 1808  SpO2 100 % 09/23/21 1808  Vitals shown include unvalidated device data.  Last Pain:  Vitals:   09/23/21 1415  TempSrc:   PainSc: 0-No pain      Patients Stated Pain Goal: 3 (09/23/21 1228)  Complications: No notable events documented.

## 2021-09-23 NOTE — Op Note (Signed)
09/23/2021  5:42 PM  PATIENT:   Shelia [name]  66 y.o. female  PRE-OPERATIVE DIAGNOSIS:  Right shoulder chronic dislocation  POST-OPERATIVE DIAGNOSIS: Same   PROCEDURE: Right shoulder reverse arthroplasty utilizing a press-fit size 5.5 Arthrex stem with a neutral metaphysis, a +6 constrained polyethylene insert, 36/+4 glenosphere on a small/+2 baseplate  SURGEON:  Sianne Tejada, Metta Clines M.D.  ASSISTANTS: Jenetta Loges, PA-C  ANESTHESIA:   General endotracheal and interscalene block with Exparel  EBL: 250 cc  SPECIMEN: None  Drains: None   PATIENT DISPOSITION:  PACU - hemodynamically stable.    PLAN OF CARE: Discharge to home after PACU  Brief history:  Patient is a 66 year old female who presented to the local emergency room with a 1 month history of severe right shoulder pain with restricted mobility after a fall.  X-rays at that time showed evidence for a fixed anterior inferior dislocation of the shoulder.  She subsequently was seen in follow-up in my office at which time the fixed dislocation was noted.  A CT scan had been obtained showing an apparent small bony avulsion of the superior posterior rotator cuff with some minimal impaction deformity of the adjacent portions of the glenoid and posterior superior humeral head.  Patient had described having unrestricted use of the shoulder prior to the injury a month ago and it was felt that given her active lifestyle and attempt at closed versus open reduction and repair of the rotator cuff would be a reasonable option.  Various treatment options reviewed we also discussed possible surgical risks which include potential for bleeding, infection, neurovascular injury, persistence of pain, loss of motion, anesthetic complication, recurrence of instability, and possible need for additional surgery.  She understands, and accepts, and agrees with the plan procedure.  Procedure in detail:  After undergoing routine preop evaluation the  patient received prophylactic antibiotics and interscalene block with Exparel was established in the holding area by the anesthesia department.  Subsequently placed spine on the operating table and underwent the smooth induction of a general endotracheal anesthesia.  With complete muscular relaxation a reduction attempt was made when we were unable to improve the alignment or had any sensation that the shoulder reduced.  Given these findings we elected to proceed with attempted open reduction with anticipated repair of the rotator cuff.  Patient was placed in the beachchair position and appropriately padded and protected.  The right shoulder region was sterilely prepped and draped in standard fashion.  Timeout was called.  A deltopectoral approach to the right shoulder was made through an approximate centimeter incision.  Skin flaps were elevated dissection carried deeply and the deltopectoral interval was developed from proximal to distal with the vein taken laterally.  The conjoined tendon was mobilized and retracted medially.  There were multiple dense adhesions beneath the deltoid and the subacromial/subdeltoid region was completely scarred and necessitated sharp dissection and electrocautery to free up this interval.  We then attempted some additional maneuvers to gain reduction which was not possible.  We then started by creating a window at the rotator interval to gain access into the shoulder joint to see if we could manipulate the shoulder and achieve a closed reduction but unfortunately the reduction was fixed with severe contractures and appeared as though ultimately the there was some material that buttonholed the humeral head preventing reduction.  With this finding we performed a subscapularis elevation using electrocautery and then identified some significant bony deformity of the humeral head both posteriorly where it impacted the glenoid  as well as a fracture superiorly and a complete denuding of  the humeral head rotator cuff insertion with just a very small portion attached posteriorly.  There was some concerns that this represented a chronic rotator cuff tear and shoulder the posterior soft tissues were extremely scarred and even after completely releasing the subscapularis was not possible to reduce the humeral head due to the posterior contractures.  We additionally found some further bony deformity and at this point it became clear that a reverse arthroplasty would be the treatment of choice.  At this time I contacted the family member who was in attendance and discussed the need for conversion to a reverse arthroplasty as opposed to rotator cuff repair.  The various options were reviewed and permission by the caregiver was provided.  At this point the humeral head was then delivered through the wound and a oscillating saw was used to perform a humeral head resection at approximate 30 degrees retroversion.  A metal cap was then placed over the cut proximal humeral surface.  With the humeral head resected we can now visualize the glenoid and identified severe contractures of the capsular tissues posteriorly suggesting significant chronicity to the dislocation.  A superior and posterior capsular release was then performed gaining complete visualization of the periphery of the glenoid.  Glenoid was then prepared placing a central guidepin and then reamed with the central peripheral reamers for a small baseplate.  Preparation completed with the central drill and tapped for a 25 mm lag screw.  Our baseplate was then assembled and was then inserted after vancomycin powder had been applied with the lag screw threads.  Excellent purchase was achieved.  All the peripheral locking screws were then placed using standard technique with excellent purchase and fixation.  A 36/+4 glenosphere was then impacted onto the baseplate and the central locking screw was placed.  We then returned our attention to the  humeral metaphysis where the canal was opened and reaming to size 6 and ultimately broaching to a size 5.5 at approximate 20 degrees retroversion.  The metaphysis was then reamed.  Our initial trial could not be reduced due to the severe posterior capsular contractures and so we did perform some additional posterior capsular releases and in addition seated the implant somewhat deeper into the humerus and then completed the preparation and we were able to achieve an appropriate reduction.  At this point the final implant was then assembled and impacted a series of trial reductions was then performed and ultimately we felt that a +6 probably with a constrained design was most appropriate.  The trial polyp was removed the final polyp was then impacted final reduction was then performed showing good motion good stability and good soft tissue balance.  We then confirmed appropriate elasticity of the subscapularis which we did mobilized and repaired back to the eyelets on the collar the implant in the arm achieved 30 degrees of external rotation with the arm at the side.  Copious irrigation was then completed.  Final hemostasis was obtained.  Vancomycin powder was then spread liberally throughout the deep soft tissue planes.  The deltopectoral interval was reapproximated with a series of figure-of-eight and 1 Vicryl sutures.  2-0 Monocryl used to the subcu layer and intracuticular 3-0 Monocryl for the skin followed by Dermabond and Aquacel dressing.  The right arm was then placed into a sling.  The patient was awakened, extubated, and taken to the recovery room in stable condition.  Jenetta Loges, PA-C was  utilized as an Environmental consultant throughout this case, essential for help with positioning the patient, positioning extremity, tissue manipulation, implantation of the prosthesis, suture management, wound closure, and intraoperative decision-making.  Marin Shutter MD   Contact # [phone]

## 2021-09-23 NOTE — Anesthesia Procedure Notes (Signed)
Procedure Name: Intubation Date/Time: 09/23/2021 2:36 PM Performed by: Montel Clock, CRNA Pre-anesthesia Checklist: Patient identified, Emergency Drugs available, Suction available, Patient being monitored and Timeout performed Patient Re-evaluated:Patient Re-evaluated prior to induction Oxygen Delivery Method: Circle system utilized Preoxygenation: Pre-oxygenation with 100% oxygen Induction Type: IV induction Ventilation: Mask ventilation without difficulty and Oral airway inserted - appropriate to patient size Laryngoscope Size: Mac and 3 Grade View: Grade I Tube type: Oral Tube size: 7.0 mm Number of attempts: 1 Airway Equipment and Method: Stylet Placement Confirmation: ETT inserted through vocal cords under direct vision, positive ETCO2 and breath sounds checked- equal and bilateral Secured at: 22 cm Tube secured with: Tape Dental Injury: Teeth and Oropharynx as per pre-operative assessment

## 2021-09-23 NOTE — Discharge Instructions (Signed)

## 2021-09-23 NOTE — H&P (Signed)
Shelia [name]    Chief Complaint: Right shoulder chronic dislocation HPI: The patient is a 66 y.o. female referred to our clinic for the evaluation of chronic right shoulder pain and profound loss of mobility.  She had an original injury almost 6 weeks ago which time she fell injuring the right shoulder and apparently had a shoulder dislocation.  This was unrecognized until a trip to the emergency room approximately 2 weeks ago at which time she was found to have a chronic dislocation.  Subsequently she has had both a CT as well as an MRI scan.  The CT scan shows very minimal bony deformity.  The MRI scan confirms a massive rotator cuff tear.  She continues with a dislocated shoulder and she is brought to the operating room at this time for planned closed versus open reduction repair of the rotator cuff.  Past Medical History:  Diagnosis Date   Hypertension    Pre-diabetes     Past Surgical History:  Procedure Laterality Date   NO PAST SURGERIES      Family History  Problem Relation Age of Onset   Diabetes Father    Hypertension Neg Hx    CAD Neg Hx    Cancer Neg Hx     Social History:  reports that she has never smoked. She has never used smokeless tobacco. She reports current alcohol use. She reports that she does not use drugs.   Medications Prior to Admission  Medication Sig Dispense Refill   aspirin 81 MG chewable tablet Chew 1 tablet (81 mg total) by mouth daily. 30 tablet 0   gabapentin (NEURONTIN) 300 MG capsule TAKE 1 CAPSULE (300 MG TOTAL) BY MOUTH 2 (TWO) TIMES DAILY. (Patient taking differently: Take 300 mg by mouth 2 (two) times daily.) 60 capsule 1   amLODipine (NORVASC) 10 MG tablet TAKE 1 TABLET (10 MG TOTAL) BY MOUTH DAILY. (Patient taking differently: Take 10 mg by mouth daily.) 90 tablet 3   chlorthalidone (HYGROTON) 25 MG tablet TAKE 1 TABLET (25 MG TOTAL) BY MOUTH DAILY. TO LOWER BLOOD PRESSURE (Patient not taking: Reported on 09/19/2021) 30 tablet 3    cyclobenzaprine (FLEXERIL) 5 MG tablet Take 1 tablet (5 mg total) by mouth 3 (three) times daily as needed for muscle spasms. (Patient not taking: Reported on 09/19/2021) 30 tablet 1   hydrALAZINE (APRESOLINE) 25 MG tablet Take 2 tablets (50 mg total) by mouth 3 (three) times daily. 180 tablet 0   naproxen (NAPROSYN) 500 MG tablet TAKE 1 TABLET (500 MG TOTAL) BY MOUTH 2 (TWO) TIMES DAILY WITH A MEAL AS NEEDED FOR PAIN (Patient not taking: Reported on 09/19/2021) 60 tablet 1   olopatadine (PATANOL) 0.1 % ophthalmic solution PLACE 1 DROP INTO BOTH EYES 2 (TWO) TIMES DAILY. (Patient not taking: Reported on 09/22/2021) 5 mL 12     Physical Exam: Right shoulder demonstrates painful and severely guarded motion as noted at her recent office visit.  She was grossly neurovascular intact in the right upper extremity.  Plain film x-rays confirm a fixed anterior inferior dislocation of the shoulder.  CT scan shows very minimal bony deformity on either glenoid or the humeral head.  Recent MRI scan demonstrates a massive tear of the superior rotator cuff.  Vitals  Temp:  [97.8 F (36.6 C)] 97.8 F (36.6 C) (01/03 1146) Pulse Rate:  [70] 70 (01/03 1146) Resp:  [15] 15 (01/03 1146) BP: (193-214)/(85-88) 193/88 (01/03 1228) SpO2:  [97 %] 97 % (01/03 1146) Weight:  [  70 kg] 70 kg (01/03 1300)  Assessment/Plan  Impression: Right shoulder chronic dislocation  Plan of Action: Procedure(s): Closed versus open reduction right shoulder, rotator cuff repair  Shelia [name] M Shelia [name] 09/23/2021, 2:11 PM Contact # [phone]

## 2021-09-23 NOTE — Anesthesia Postprocedure Evaluation (Signed)
Anesthesia Post Note  Patient: Shelia [name]  Procedure(s) Performed: Closed and open reduction right shoulder,  rotator cuff repair (Right: Shoulder) REVERSE SHOULDER ARTHROPLASTY (Right: Shoulder)     Patient location during evaluation: PACU Anesthesia Type: Regional and General Level of consciousness: awake and alert Pain management: pain level controlled Vital Signs Assessment: post-procedure vital signs reviewed and stable Respiratory status: spontaneous breathing, nonlabored ventilation, respiratory function stable and patient connected to nasal cannula oxygen Cardiovascular status: blood pressure returned to baseline and stable Postop Assessment: no apparent nausea or vomiting Anesthetic complications: no   No notable events documented.  Last Vitals:  Vitals:   09/23/21 1805 09/23/21 1815  BP: (!) 154/68 (!) 156/76  Pulse: 87 75  Resp: 18 (!) 23  Temp: 37 C   SpO2: 100% 93%    Last Pain:  Vitals:   09/23/21 1815  TempSrc:   PainSc: 0-No pain                 Bucky Grigg S

## 2021-09-23 NOTE — Anesthesia Procedure Notes (Signed)
Anesthesia Regional Block: Interscalene brachial plexus block   Pre-Anesthetic Checklist: , timeout performed,  Correct Patient, Correct Site, Correct Laterality,  Correct Procedure, Correct Position, site marked,  Risks and benefits discussed,  Surgical consent,  Pre-op evaluation,  At surgeon's request and post-op pain management  Laterality: Right  Prep: Maximum Sterile Barrier Precautions used, chloraprep       Needles:  Injection technique: Single-shot  Needle Type: Echogenic Stimulator Needle     Needle Length: 5cm  Needle Gauge: 22     Additional Needles:   Procedures:,,,, ultrasound used (permanent image in chart),,    Narrative:  Start time: 09/23/2021 2:05 PM End time: 09/23/2021 2:08 PM Injection made incrementally with aspirations every 5 mL.  Performed by: Personally  Anesthesiologist: Elmer Picker, MD  Additional Notes: Monitors applied. No increased pain on injection. No increased resistance to injection. Injection made in 5cc increments. Good needle visualization. Patient tolerated procedure well.

## 2021-09-23 NOTE — Progress Notes (Signed)
Assisted Dr. Hulan Fray with right, ultrasound guided, interscalene brachial plexus block. Side rails up, monitors on throughout procedure. See vital signs in flow sheet. Tolerated Procedure well.

## 2021-09-24 ENCOUNTER — Other Ambulatory Visit: Payer: Self-pay

## 2021-09-26 ENCOUNTER — Other Ambulatory Visit: Payer: Self-pay | Admitting: Chiropractic Medicine

## 2021-09-26 ENCOUNTER — Encounter (HOSPITAL_COMMUNITY): Payer: Self-pay | Admitting: Orthopedic Surgery

## 2021-09-26 DIAGNOSIS — M25511 Pain in right shoulder: Secondary | ICD-10-CM

## 2021-10-20 IMAGING — DX DG SHOULDER 2+V*R*
3 series · 3 of 3 positions shown · non-contrast
Comparison: None.

CLINICAL DATA: Right shoulder pain.  No known injury.

EXAM:
RIGHT SHOULDER - 2+ VIEW

[shoulder grashey]
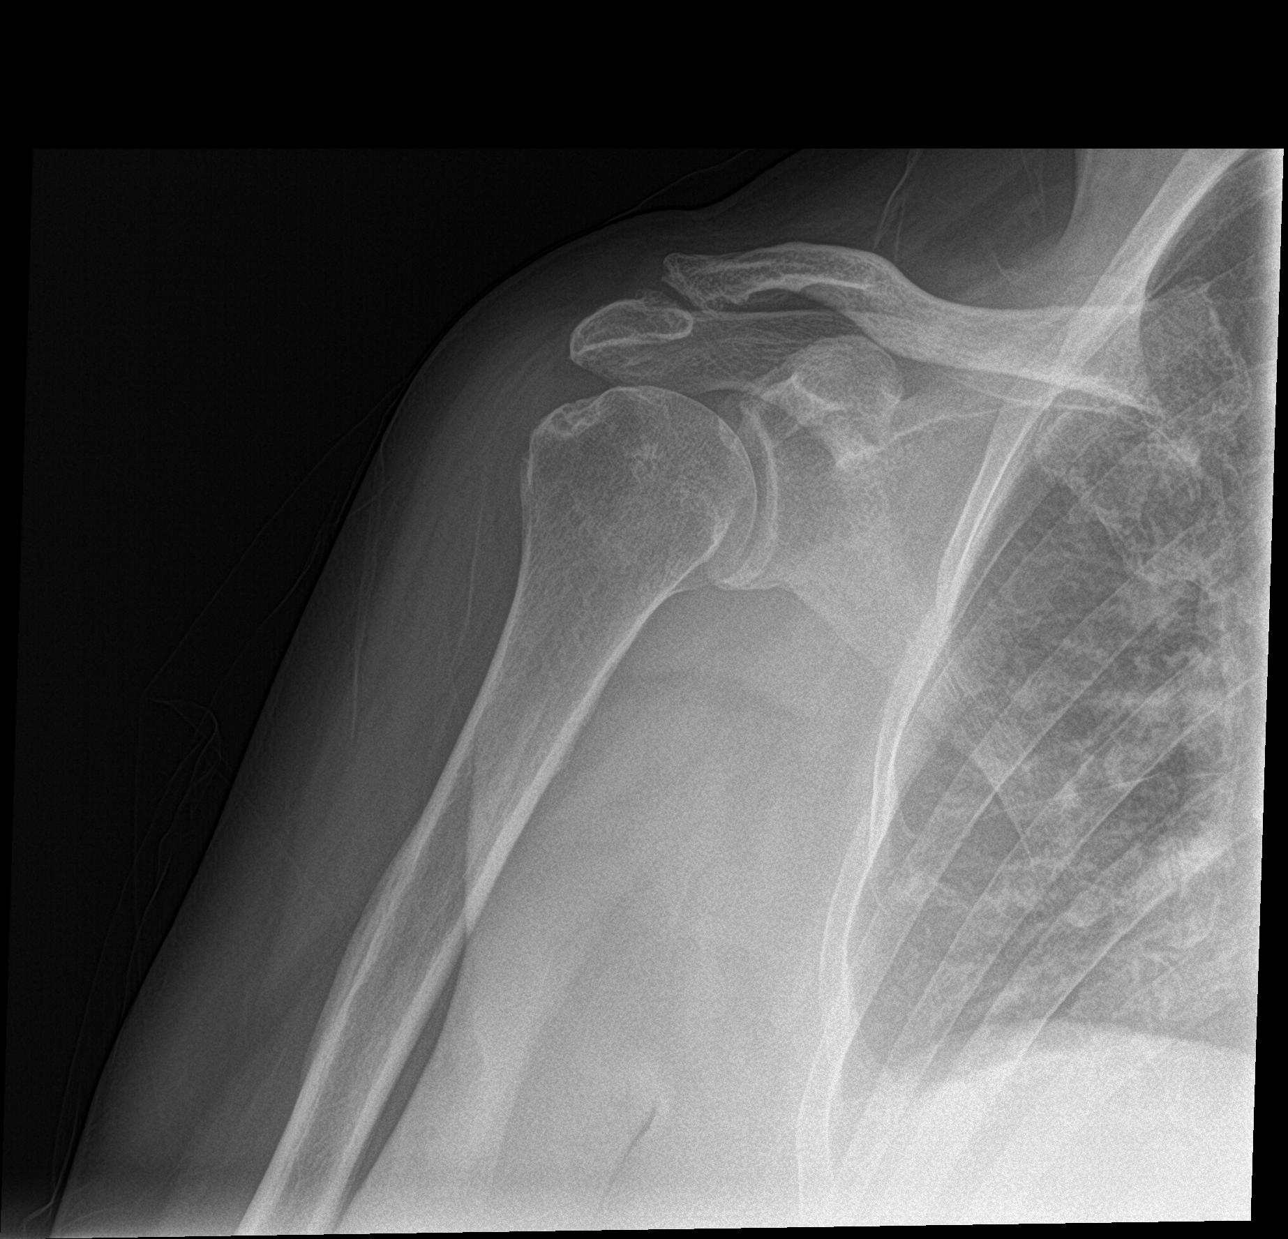

[shoulder y view]
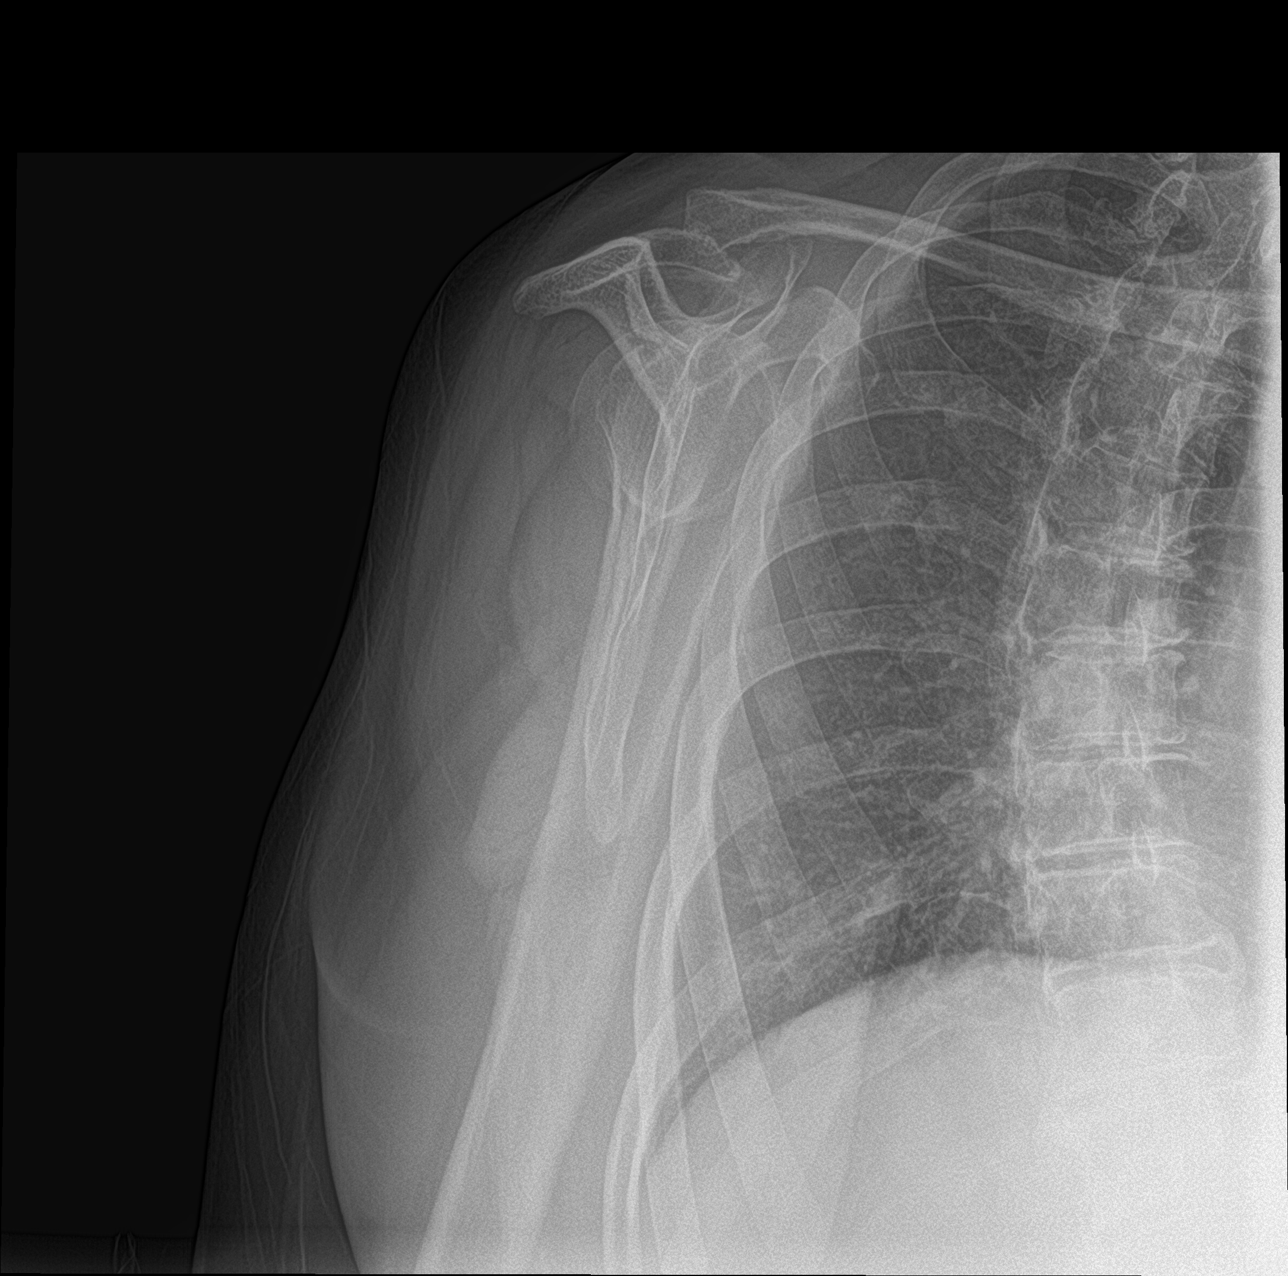

[shoulder ap neutral]
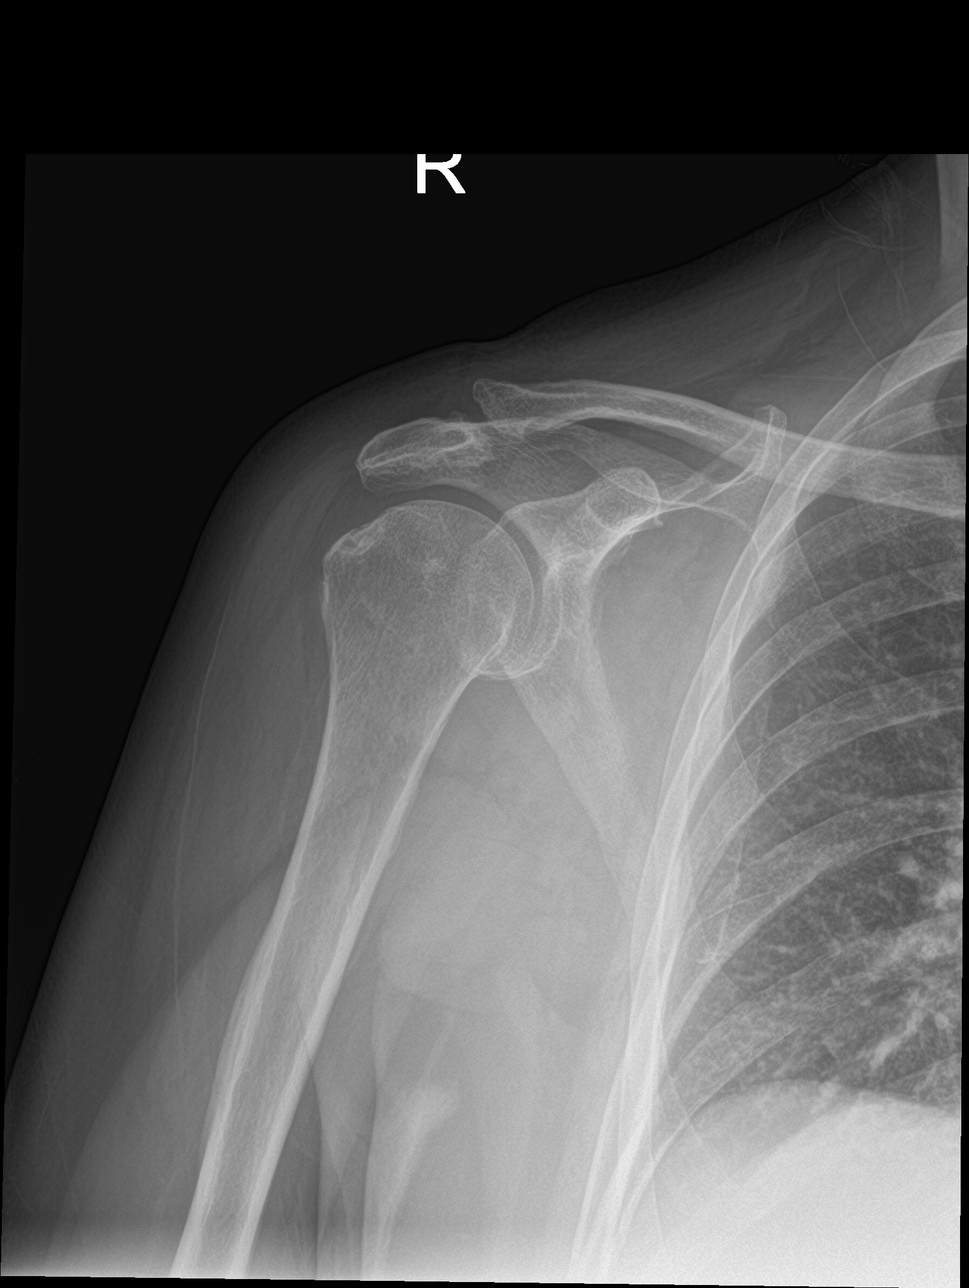

[3 of 3 positions shown; findings below may reference images not displayed]

FINDINGS: Degenerative changes in the AC joint with joint space narrowing and
spurring. Glenohumeral joint is maintained. No acute bony
abnormality. Specifically, no fracture, subluxation, or dislocation.
Soft tissues are intact.
IMPRESSION: Degenerative changes in the right AC joint. No acute bony
abnormality.
